# Patient Record
Sex: Female | Born: 2000
Health system: Southern US, Community
[De-identification: ages and names within clinical notes are randomized; demographics above are authoritative.]

## PROBLEM LIST (undated history)

## (undated) DIAGNOSIS — T8859XA Other complications of anesthesia, initial encounter: Secondary | ICD-10-CM

## (undated) DIAGNOSIS — F909 Attention-deficit hyperactivity disorder, unspecified type: Secondary | ICD-10-CM

## (undated) DIAGNOSIS — T4145XA Adverse effect of unspecified anesthetic, initial encounter: Secondary | ICD-10-CM

## (undated) DIAGNOSIS — D573 Sickle-cell trait: Secondary | ICD-10-CM

## (undated) DIAGNOSIS — T7840XA Allergy, unspecified, initial encounter: Secondary | ICD-10-CM

## (undated) DIAGNOSIS — J45909 Unspecified asthma, uncomplicated: Secondary | ICD-10-CM

## (undated) DIAGNOSIS — E669 Obesity, unspecified: Secondary | ICD-10-CM

## (undated) HISTORY — DX: Unspecified asthma, uncomplicated: J45.909

## (undated) HISTORY — PX: EYE SURGERY: SHX253

---
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
## 2020-01-03 ENCOUNTER — Other Ambulatory Visit: Payer: Self-pay

## 2020-01-03 ENCOUNTER — Encounter: Payer: Self-pay | Admitting: Pediatrics

## 2020-01-03 ENCOUNTER — Ambulatory Visit (INDEPENDENT_AMBULATORY_CARE_PROVIDER_SITE_OTHER): Payer: Medicaid Other | Admitting: Pediatrics

## 2020-01-03 VITALS — BP 118/78 | Ht 69.0 in | Wt 305.0 lb

## 2020-01-03 DIAGNOSIS — Z719 Counseling, unspecified: Secondary | ICD-10-CM

## 2020-01-03 DIAGNOSIS — Z79899 Other long term (current) drug therapy: Secondary | ICD-10-CM

## 2020-01-03 DIAGNOSIS — F900 Attention-deficit hyperactivity disorder, predominantly inattentive type: Secondary | ICD-10-CM

## 2020-01-03 DIAGNOSIS — R278 Other lack of coordination: Secondary | ICD-10-CM

## 2020-01-03 DIAGNOSIS — Z7189 Other specified counseling: Secondary | ICD-10-CM

## 2020-01-03 DIAGNOSIS — E66813 Obesity, class 3: Secondary | ICD-10-CM

## 2020-01-03 MED ORDER — ATOMOXETINE HCL 80 MG PO CAPS
80.0000 mg | ORAL_CAPSULE | Freq: Every day | ORAL | 2 refills | Status: DC
Start: 2020-01-03 — End: 2020-01-29

## 2020-01-03 MED ORDER — ATOMOXETINE HCL 25 MG PO CAPS: ORAL_CAPSULE | ORAL | 0 refills | Status: DC

## 2020-01-03 NOTE — Progress Notes (Signed)
Medication Check  Patient ID: [name]  DOB: [birth year]  MRN: [id number]  DATE:01/03/20 Chales Salmon, MD  Accompanied by: self Patient Lives with: mother and brother age 19  HISTORY/CURRENT STATUS: Chief Complaint - Polite and cooperative and present for medical follow up for medication management of ADHD, dysgraphia and Learning differences. Last follow u July 22, 201 and currently prescribed Vyvanse 30 mg.  Reports that with medication trial it worked for focus at work but when wearing off she felt aggressive and irritable.  She did not continue medication and did not request a refill.  Patient also voiced concern for excessive weight, remarked on weight over 305 lb.  Feels this may be due to Depo Shots that she has been on for over two years and has had constant weight gains over this time.  She has been reducing intake and has adequate exercise but is upset that she is not able to lose weight.  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  No Sleep concerns.  Elimination: no concerns  Individual Medical History/ Review of Systems: Changes? :No  Family Medical/ Social History: Changes? No  Current Medications:  None Medication Side Effects: None  MENTAL HEALTH: Mental Health Issues:  Denies sadness, loneliness or depression. No self harm or thoughts of self harm or injury. Denies fears, worries and anxieties. Has good peer relations and is not a bully nor is victimized.  Review of Systems  Constitutional: Negative.   HENT: Negative.    Eyes: Negative.   Respiratory: Negative.   Cardiovascular: Negative.   Gastrointestinal: Negative.   Endocrine: Negative.   Genitourinary: Negative.   Musculoskeletal: Negative.   Skin: Negative.   Allergic/Immunologic: Positive for environmental allergies and food allergies.  Neurological: Negative for seizures, speech difficulty and headaches.  Hematological: Negative.   Psychiatric/Behavioral: Negative for agitation, decreased concentration, dysphoric mood and sleep disturbance. The patient is not nervous/anxious and is not hyperactive.   All other systems reviewed and are negative.   PHYSICAL EXAM; Vitals:   01/03/20 0909  Weight: (!) 305 lb (138.3 kg)  Height: 5\' 9"  (1.753 m)   Body mass index is 45.04 kg/m.  General Physical Exam: Unchanged from previous exam, date:09/27/2019   DIAGNOSES:    ICD-10-CM   1. ADHD (attention deficit hyperactivity disorder), inattentive type  F90.0   2. Developmental dysgraphia  R27.8   3. Obesity, Class III, BMI 40-49.9 (morbid obesity) (HCC)  E66.01   4. Medication management  Z79.899   5. Patient counseled  Z71.9   6. Counseling and coordination of care  Z71.89     RECOMMENDATIONS:  Patient Instructions  DISCUSSION: Counseled regarding the following coordination of care items:  Continue medication as directed Discontinue Vyvanse  Trial Strattera 80 mg every morning Begin with 25 mg one for three days and then increase to two for three days.  Then Strattera 80 mg every day, with food. RX for above e-scribed and sent to pharmacy on record  [id number] - [place], [place] - [address] [place]  [place] [address] Phone: [phone] Fax: [phone]  Recommend discontinuation of Depo shots for birth control due to excessive weight gain over the last two years. Trial MONOPHASIC estrogen product and use pills as "active only".  Begin with a higher estrogen  dose such as in [place] - 35 mcg.  Patient will discuss with provider.  Counseled regarding obtaining refills by calling pharmacy first to use automated refill request then if needed, call our office leaving a detailed message on the refill line.  Counseled medication administration, effects, and possible side effects.  ADHD medications discussed to include different medications and pharmacologic properties of each. Recommendation for specific medication to include dose, administration, expected effects, possible side effects and the risk to benefit ratio of medication management.  Advised importance of:  Good sleep hygiene (8- 10 hours per night)  Limited screen time (none on school nights, no more than 2 hours on weekends)  Regular exercise(outside and active play)  Healthy eating (drink water, no sodas/sweet tea)  Regular family meals have been linked to lower levels of adolescent risk-taking behavior.  Adolescents who frequently eat meals with their family are less likely to engage in risk behaviors than those who never or rarely eat with their families.  So it is never too early to start this tradition.       Patient verbalized understanding of all topics discussed.  NEXT APPOINTMENT:  No follow-ups on file.  Medical Decision-making: More than 50% of the appointment was spent counseling and discussing diagnosis and management of symptoms with the patient and family.  Counseling Time: 25 minutes Total Contact Time: 30 minutes

## 2020-01-03 NOTE — Patient Instructions (Signed)
DISCUSSION: Counseled regarding the following coordination of care items:  Continue medication as directed Discontinue Vyvanse  Trial Strattera 80 mg every morning Begin with 25 mg one for three days and then increase to two for three days.  Then Strattera 80 mg every day, with food. RX for above e-scribed and sent to pharmacy on record  [id number] - [place], [place] - [address] AT [facility] [address] [place] [address] Phone: [phone] Fax: [phone]  Recommend discontinuation of Depo shots for birth control due to excessive weight gain over the last two years. Trial MONOPHASIC estrogen product and use pills as "active only".  Begin with a higher estrogen dose such as in Zovia - 35 mcg.  Patient will discuss with provider.  Counseled regarding obtaining refills by calling pharmacy first to use automated refill request then if needed, call our office leaving a detailed message on the refill line.  Counseled medication administration, effects, and possible side effects.  ADHD medications discussed to include different medications and pharmacologic properties of each. Recommendation for specific medication to include dose, administration, expected effects, possible side effects and the risk to benefit ratio of medication management.  Advised importance of:  Good sleep hygiene (8- 10 hours per night)  Limited screen time (none on school nights, no more than 2 hours on weekends)  Regular exercise(outside and active play)  Healthy eating (drink water, no sodas/sweet tea)  Regular family meals have been linked to lower levels of adolescent risk-taking behavior.  Adolescents who frequently eat meals with their family are less likely to engage in risk behaviors than those who never or rarely eat with their families.  So it is never too early to start this tradition.

## 2020-01-28 ENCOUNTER — Other Ambulatory Visit: Payer: Self-pay | Admitting: Pediatrics

## 2020-01-28 NOTE — Telephone Encounter (Signed)
Last visit 01/03/2020 next visit 04/11/2020

## 2020-01-29 NOTE — Telephone Encounter (Signed)
RX for above e-scribed and sent to pharmacy on record  [id number] - [place], [place] - [address] [place] [address] Phone: [phone] Fax: [phone]    

## 2020-02-04 ENCOUNTER — Other Ambulatory Visit: Payer: Self-pay | Admitting: Pediatrics

## 2020-02-04 MED ORDER — ATOMOXETINE HCL 40 MG PO CAPS
40.0000 mg | ORAL_CAPSULE | Freq: Every day | ORAL | 2 refills | Status: DC
Start: 2020-02-04 — End: 2020-03-03

## 2020-02-04 NOTE — Telephone Encounter (Signed)
RX for above e-scribed and sent to pharmacy on record  [id number] - [place], [place] - [address] [place] [address] Phone: [phone] Fax: [phone]    

## 2020-02-29 ENCOUNTER — Other Ambulatory Visit: Payer: Self-pay | Admitting: Pediatrics

## 2020-03-03 NOTE — Telephone Encounter (Signed)
Strattera 40 mg daily, # 90 with 1 RF's.RX for above e-scribed and sent to pharmacy on record  [id number] - [place], [place] - [address] AT [facility] [address] [place] [address] Phone: [phone] Fax: [phone]

## 2020-04-11 ENCOUNTER — Telehealth (INDEPENDENT_AMBULATORY_CARE_PROVIDER_SITE_OTHER): Payer: Medicaid Other | Admitting: Pediatrics

## 2020-04-11 ENCOUNTER — Other Ambulatory Visit: Payer: Self-pay

## 2020-04-11 ENCOUNTER — Encounter: Payer: Self-pay | Admitting: Pediatrics

## 2020-04-11 DIAGNOSIS — Z719 Counseling, unspecified: Secondary | ICD-10-CM

## 2020-04-11 DIAGNOSIS — Z7189 Other specified counseling: Secondary | ICD-10-CM

## 2020-04-11 DIAGNOSIS — R278 Other lack of coordination: Secondary | ICD-10-CM | POA: Diagnosis not present

## 2020-04-11 DIAGNOSIS — F900 Attention-deficit hyperactivity disorder, predominantly inattentive type: Secondary | ICD-10-CM

## 2020-04-11 DIAGNOSIS — Z79899 Other long term (current) drug therapy: Secondary | ICD-10-CM | POA: Diagnosis not present

## 2020-04-11 NOTE — Progress Notes (Signed)
[facility] [address], [place]. [address] [place] [address] Dept: [phone] Dept Fax: [phone]  Medication Check by Caregility due to COVID-19  Patient ID:  [name]  female DOB: 11-Jun-2000   20 y.o.   MRN: [id number]   DATE:04/11/20  PCP: Chales Salmon, MD  Interviewed: [name]  Location: Mother's home Provider location: [facility] office  Virtual Visit via Video Note Connected with [name] on 04/11/20 at 11:30 AM EST by video enabled telemedicine application and verified that I am speaking with the correct person using two identifiers.     I discussed the limitations, risks, security and privacy concerns of performing an evaluation and management service by telephone and the availability of in person appointments. I also discussed with the parent/patient that there may be a patient responsible charge related to this service. The parent/patient expressed understanding and agreed to proceed.  HISTORY OF PRESENT ILLNESS/CURRENT STATUS: [name] is being followed for medication management for ADHD, dysgraphia and learning differneces.   Last visit on 01/03/20  Briell currently prescribed Strattera 40 mg taking daily, feels it is good and working but the down side is that it does not give you an energy feeling.    Behaviors: takes time to wake up, most teachers are lecturing for an hour and her brain is not engaged.  Eating well (eating breakfast, lunch and dinner).   Elimination: no concerns  Sleeping: Going to bed around 2300 - Sleeping through the night. May nap at end of day.  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  Activities/ Exercise: daily  Screen time: (phone, tablet, TV, computer): non-essential, reduced  MEDICAL HISTORY: Individual Medical History/ Review of Systems: Changes? :No  Family Medical/ Social History: Changes? No   Patient Lives with: mother In Dorm - private room, shares kitchen, has own bathroom for the most part   MENTAL HEALTH: No Concerns ASSESSMENT:  [name] is a 20  Year old with improved and well controlled ADHD and dysgraphia.  Doing very well on Strattera with  ADHD stable with medication management.  She is doing well in Culinary class with appropriate school accommodations with progress academically  DIAGNOSES:    ICD-10-CM   1. ADHD (attention deficit hyperactivity disorder), inattentive type  F90.0   2. Developmental dysgraphia  R27.8   3. Medication management  Z79.899   4. Patient counseled  Z71.9   5. Parenting dynamics counseling  Z71.89   6. Counseling and coordination of care  Z71.89      RECOMMENDATIONS:  Patient Instructions  DISCUSSION: Counseled regarding the following coordination of care items:  Continue medication as directed Strattera 40 mg every morning RX for above e-scribed and sent to pharmacy on record  [id number] - [place], [place] - [address] AT [facility] [address]  [place] [address] Phone: [phone] Fax: [phone]   Counseled regarding obtaining refills by calling pharmacy first to use automated refill request then if needed, call our office leaving a detailed message on the refill line.  Counseled  medication administration, effects, and possible side effects.  ADHD medications discussed to include different medications and pharmacologic properties of each. Recommendation for specific medication to include dose, administration, expected effects, possible  side effects and the risk to benefit ratio of medication management.  Advised importance of:  Good sleep hygiene (8- 10 hours per night)  Limited screen time (none on school nights, no more than 2 hours on weekends)  Regular exercise(outside and active play)  Healthy eating (drink water, no sodas/sweet tea)   Counseling at this visit included the review of old records and/or current chart.   Counseling included the following discussion points presented at every visit to improve understanding and treatment compliance.  Recent health history and today's examination Growth and development with anticipatory guidance provided regarding brain growth, executive function maturation and pre or pubertal development.  School progress and continued advocay for appropriate accommodations to include maintain Structure, routine, organization, reward, motivation and consequences.  Additionally the patient was counseled to take medication while driving.       NEXT APPOINTMENT:  Return in about 3 months (around 07/09/2020) for Medication Check. Please call the office for a sooner appointment if problems arise.  Medical Decision-making:  I spent 25 minutes dedicated to the care of this patient on the date of this encounter to include face to face time with the patient and/or parent reviewing medical records and documentation by teachers, performing and discussing the assessment and treatment plan, reviewing and explaining completed speciality labs and obtaining specialty lab samples.  The patient and/or parent was provided an opportunity to ask questions and all were answered. The patient and/or parent agreed with the plan and demonstrated an understanding of the instructions.   The patient and/or parent was advised to call back or seek an in-person evaluation if the symptoms worsen or if the condition fails to improve as anticipated.  I provided 25 minutes of non-face-to-face time during this  encounter.   Completed record review for 0 minutes prior to and after the virtual video visit.   Counseling Time: 25 minutes   Total Contact Time: 25 minutes

## 2020-04-11 NOTE — Patient Instructions (Signed)
DISCUSSION: Counseled regarding the following coordination of care items:  Continue medication as directed Strattera 40 mg every morning RX for above e-scribed and sent to pharmacy on record  [id number] - [place], [place] - [address] AT [facility] [address] [place] [address] Phone: [phone] Fax: [phone]   Counseled regarding obtaining refills by calling pharmacy first to use automated refill request then if needed, call our office leaving a detailed message on the refill line.  Counseled medication administration, effects, and possible side effects.  ADHD medications discussed to include different medications and pharmacologic properties of each. Recommendation for specific medication to include dose, administration, expected effects, possible side effects and the risk to benefit ratio of medication management.  Advised importance of:  Good sleep hygiene (8- 10 hours per night)  Limited screen time (none on school nights, no more than 2 hours on weekends)  Regular exercise(outside and active play)  Healthy eating (drink water, no sodas/sweet tea)   Counseling at this visit included the review of old records and/or current chart.   Counseling included the following discussion points presented at every visit to improve understanding and treatment compliance.  Recent health history and today's examination Growth and development with anticipatory guidance provided regarding brain growth, executive function maturation and pre or pubertal development.  School progress and continued advocay for appropriate accommodations to include maintain Structure, routine, organization, reward, motivation and consequences.  Additionally the patient was counseled to take medication while driving.

## 2020-06-14 IMAGING — CR DG CHEST 2V
2 series · 2 of 2 positions shown · non-contrast
Comparison: 01/02/2009

CLINICAL DATA: Chest pain and shortness of Breath

EXAM:
CHEST - 2 VIEW

[chest pa]
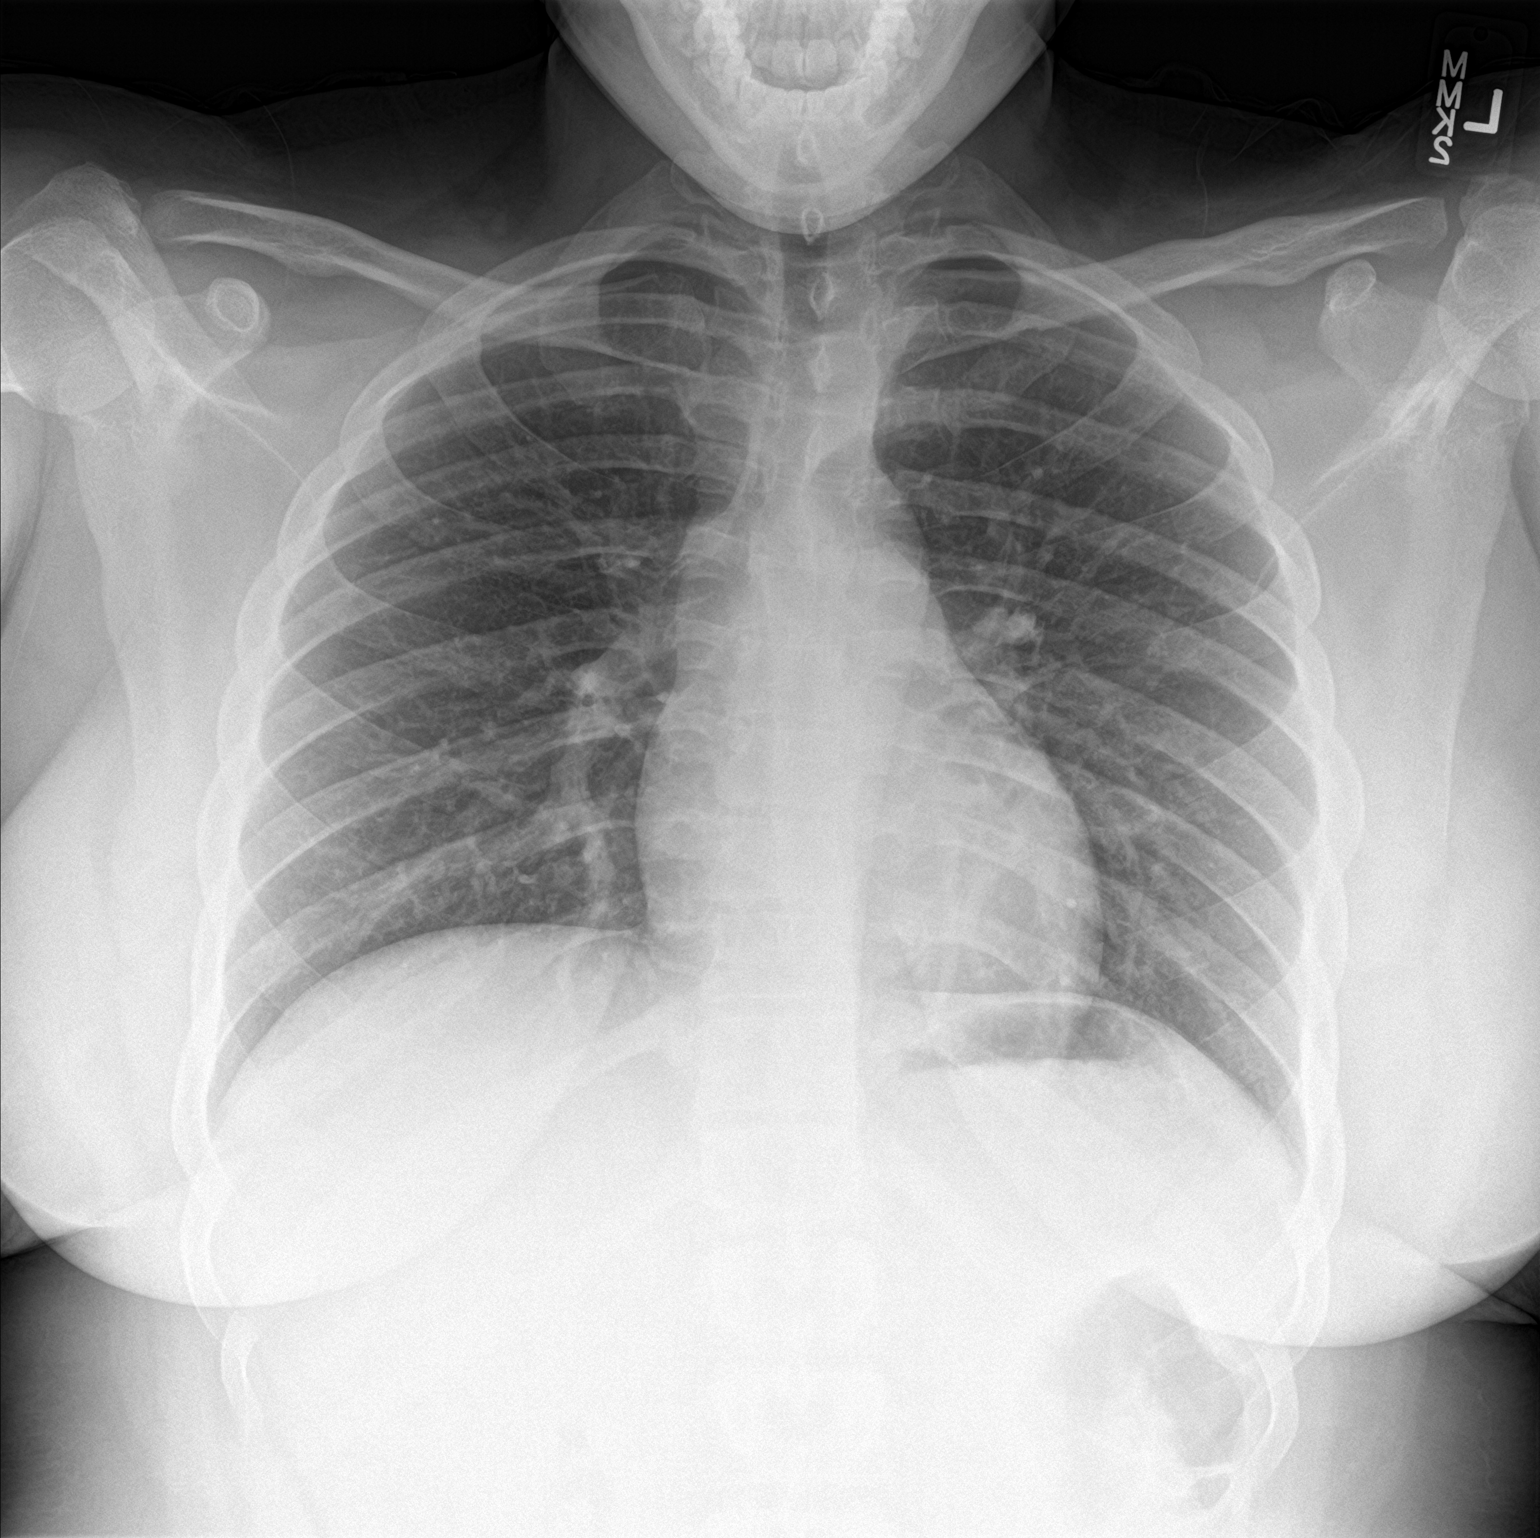

[chest lat]
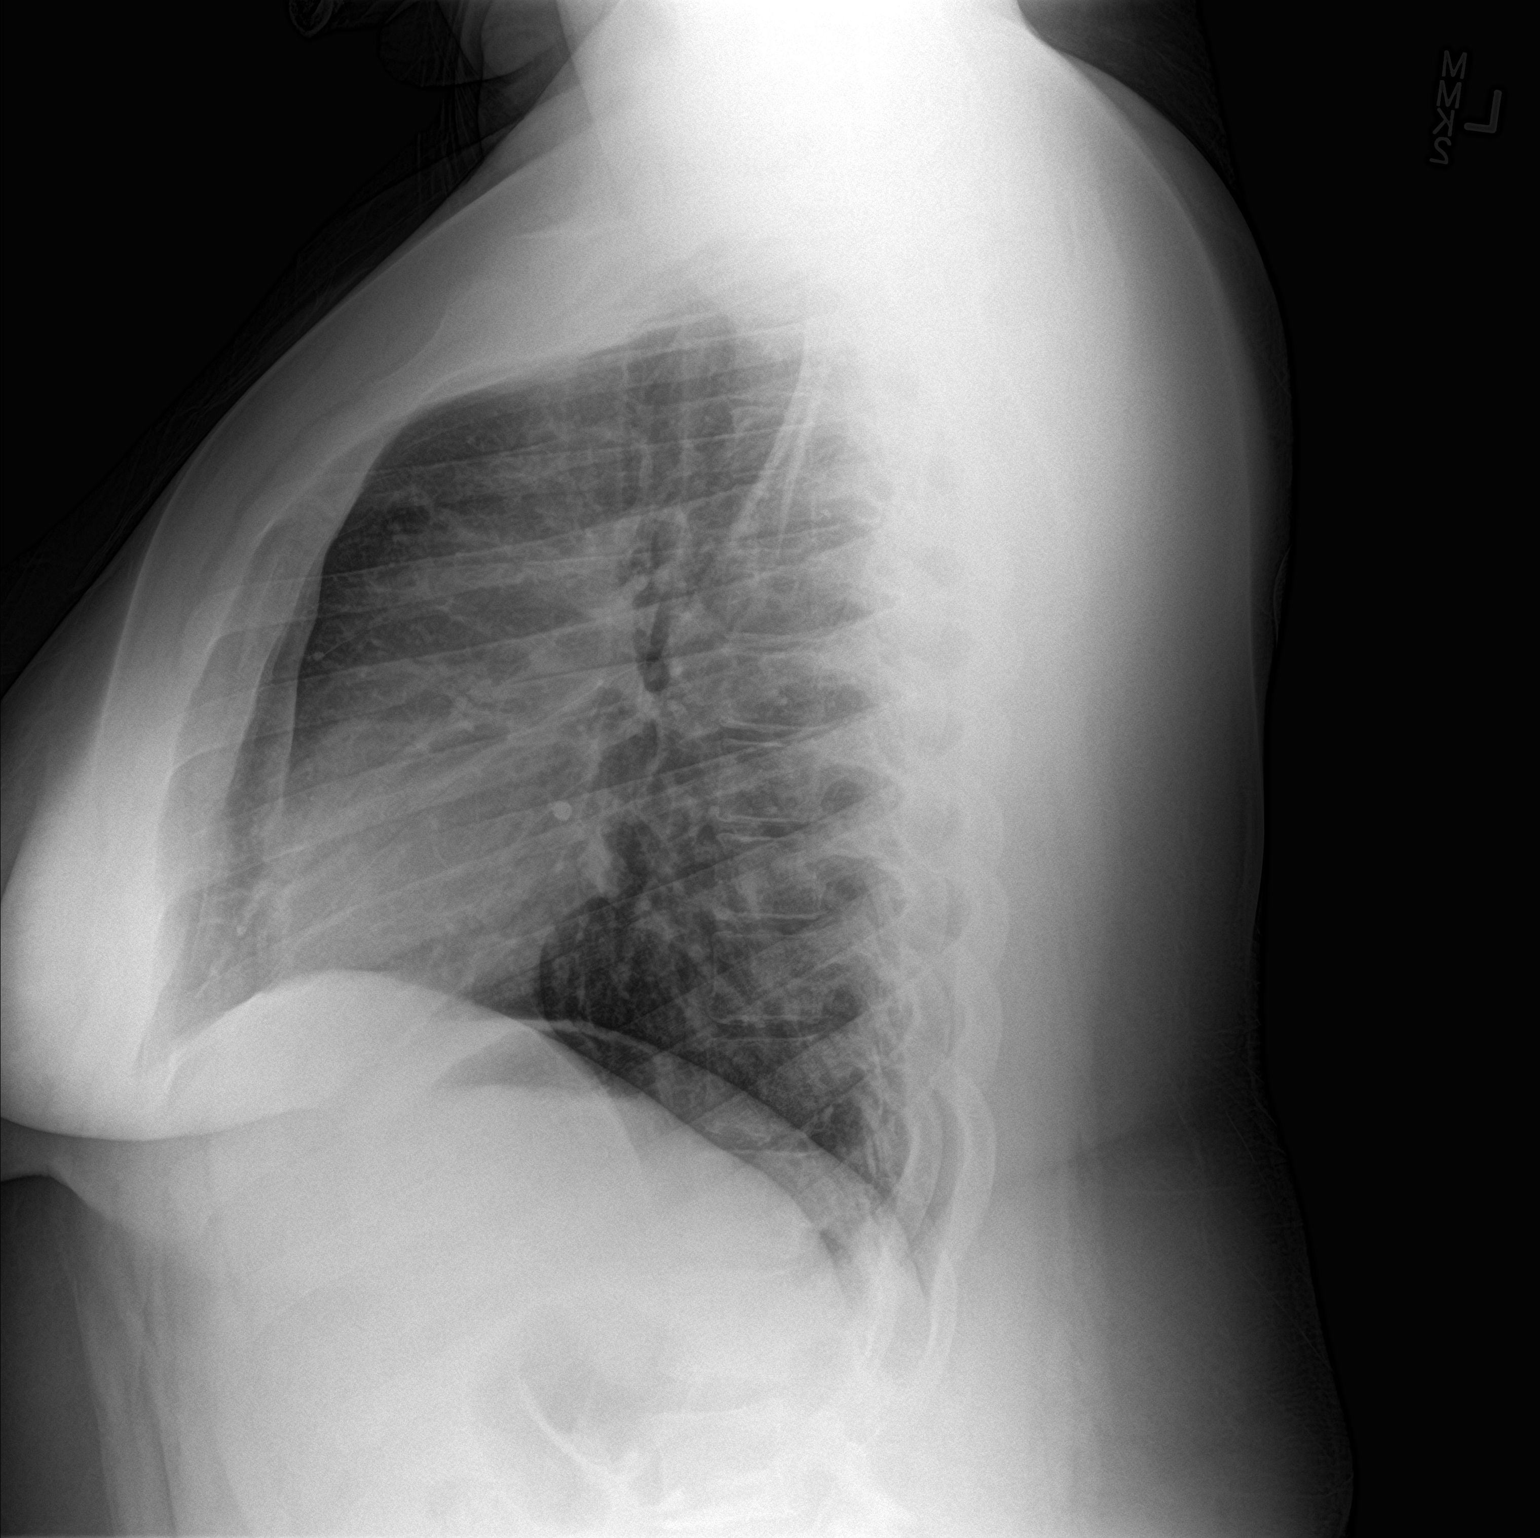

[2 of 2 positions shown; findings below may reference images not displayed]

FINDINGS: The heart size and mediastinal contours are within normal limits.
Both lungs are clear. The visualized skeletal structures are
unremarkable.
IMPRESSION: No active cardiopulmonary disease.

## 2020-09-08 ENCOUNTER — Encounter (HOSPITAL_COMMUNITY): Payer: Self-pay | Admitting: Emergency Medicine

## 2020-09-08 ENCOUNTER — Emergency Department (HOSPITAL_COMMUNITY)
Admission: EM | Admit: 2020-09-08 | Discharge: 2020-09-09 | Disposition: A | Discharge status: Home / Self Care | Payer: Medicaid Other | Attending: Emergency Medicine | Admitting: Emergency Medicine

## 2020-09-08 ENCOUNTER — Other Ambulatory Visit: Payer: Self-pay

## 2020-09-08 ENCOUNTER — Emergency Department (HOSPITAL_COMMUNITY): Payer: Medicaid Other

## 2020-09-08 DIAGNOSIS — S70312A Abrasion, left thigh, initial encounter: Secondary | ICD-10-CM | POA: Insufficient documentation

## 2020-09-08 DIAGNOSIS — S60411A Abrasion of left index finger, initial encounter: Secondary | ICD-10-CM | POA: Diagnosis not present

## 2020-09-08 DIAGNOSIS — J45909 Unspecified asthma, uncomplicated: Secondary | ICD-10-CM | POA: Insufficient documentation

## 2020-09-08 DIAGNOSIS — S0181XA Laceration without foreign body of other part of head, initial encounter: Secondary | ICD-10-CM | POA: Diagnosis not present

## 2020-09-08 DIAGNOSIS — Y92009 Unspecified place in unspecified non-institutional (private) residence as the place of occurrence of the external cause: Secondary | ICD-10-CM | POA: Insufficient documentation

## 2020-09-08 DIAGNOSIS — S81811A Laceration without foreign body, right lower leg, initial encounter: Secondary | ICD-10-CM | POA: Insufficient documentation

## 2020-09-08 DIAGNOSIS — W540XXA Bitten by dog, initial encounter: Secondary | ICD-10-CM | POA: Insufficient documentation

## 2020-09-08 DIAGNOSIS — S30811A Abrasion of abdominal wall, initial encounter: Secondary | ICD-10-CM | POA: Insufficient documentation

## 2020-09-08 DIAGNOSIS — S0990XA Unspecified injury of head, initial encounter: Secondary | ICD-10-CM | POA: Diagnosis present

## 2020-09-08 DIAGNOSIS — S81851A Open bite, right lower leg, initial encounter: Secondary | ICD-10-CM

## 2020-09-08 MED ORDER — OXYCODONE HCL 5 MG PO TABS
10.0000 mg | ORAL_TABLET | Freq: Once | ORAL | Status: AC
  Administered 2020-09-08: 10 mg via ORAL
  Filled 2020-09-08: qty 2

## 2020-09-08 MED ORDER — OXYCODONE-ACETAMINOPHEN 5-325 MG PO TABS: 1.0000 | ORAL_TABLET | ORAL | 0 refills | Status: DC | PRN

## 2020-09-08 MED ORDER — AMOXICILLIN-POT CLAVULANATE 875-125 MG PO TABS: 1.0000 | ORAL_TABLET | Freq: Two times a day (BID) | ORAL | 0 refills | Status: DC

## 2020-09-08 MED ORDER — BACITRACIN ZINC 500 UNIT/GM EX OINT: TOPICAL_OINTMENT | Freq: Once | CUTANEOUS | Status: AC

## 2020-09-08 MED ORDER — AMOXICILLIN-POT CLAVULANATE 875-125 MG PO TABS
1.0000 | ORAL_TABLET | Freq: Once | ORAL | Status: AC
  Administered 2020-09-08: 1 via ORAL
  Filled 2020-09-08: qty 1

## 2020-09-08 MED ORDER — LIDOCAINE-EPINEPHRINE (PF) 2 %-1:200000 IJ SOLN
20.0000 mL | Freq: Once | INTRAMUSCULAR | Status: AC
  Administered 2020-09-09: 20 mL via INTRADERMAL
  Filled 2020-09-08: qty 20

## 2020-09-08 MED ORDER — ACETAMINOPHEN 325 MG PO TABS
650.0000 mg | ORAL_TABLET | Freq: Once | ORAL | Status: AC | PRN
  Administered 2020-09-08: 650 mg via ORAL
  Filled 2020-09-08: qty 2

## 2020-09-08 MED ORDER — LIDOCAINE HCL (PF) 1 % IJ SOLN
INTRAMUSCULAR | Status: AC
  Filled 2020-09-08: qty 30

## 2020-09-08 NOTE — ED Notes (Signed)
Pt taken to bathroom in wheelchair with assistance

## 2020-09-08 NOTE — ED Provider Notes (Signed)
Emergency Medicine Provider Triage Evaluation Note  [name] , a 20 y.o. female  was evaluated in triage.  Pt complains of dog bite.  Patient was attacked by her aunts dogs just prior to arrival.  She has had a laceration to the chin as well as a laceration to the back of the right ankle and a scratch to the abdomen and left upper thigh.  She is going to clarify with her aunt whether or not these dogs are up-to-date on rabies vaccination.  She will also check with her mom on tetanus vaccination.  Patient is very anxious, counseled patient on typical process of treatment for dog bite.  Review of Systems  Positive: Multiple abrasions wounds Negative: Fever, redness, swelling  Physical Exam  BP 118/73 (BP Location: Right Arm)   Pulse (!) 120   Temp 98.4 F (36.9 C) (Oral)   Resp 19   SpO2 98%  Gen:   Awake, no distress   Resp:  Normal effort  MSK:   Moves extremities without difficulty  Other:  Laceration to the left side of the chin, and the back of the right ankle, linear scratches to the left thigh and abdomen.  Medical Decision Making  Medically screening exam initiated at 5:03 PM.  Appropriate orders placed.  [name] was informed that the remainder of the evaluation will be completed by another provider, this initial triage assessment does not replace that evaluation, and the importance of remaining in the ED until their evaluation is complete.     Dartha Lodge, PA-C 09/08/20 1706    Mancel Bale, MD 09/08/20 [phone]

## 2020-09-08 NOTE — Discharge Instructions (Addendum)
The sutures placed in your face will dissolve on their own.  You will need to see your family doctor for removal of the leg sutures in 10-14 days.

## 2020-09-08 NOTE — Progress Notes (Signed)
Orthopedic Tech Progress Note Patient Details:  [name] May 21, 2000 [id number]  Ortho Devices Type of Ortho Device: CAM walker Ortho Device/Splint Location: rle Ortho Device/Splint Interventions: Ordered, Adjustment, Application   Post Interventions Patient Tolerated: Well Instructions Provided: Care of device, Adjustment of device  [name] Post 09/08/2020, 11:28 PM

## 2020-09-08 NOTE — ED Triage Notes (Addendum)
Pt reports being attacked by 2 of her aunt's dogs today. Unsure of dogs vaccination status. Wound to her chin, left ankle, and abdomen.

## 2020-09-08 NOTE — ED Provider Notes (Addendum)
[facility] EMERGENCY DEPARTMENT Provider Note   CSN: [id number] Arrival date & time: 09/08/20  1612     History Chief Complaint  Patient presents with   Animal Bite    [name] is a 20 y.o. female.  HPI 20 year old female presents with dog bites and lacerations.  This occurred earlier today just prior to arrival.  She was at her mom's friend's house to learn about how to talk stent and the dogs were aggressive towards her and she fell to the ground and they attacked her.  Has multiple scratches to her abdomen, left thigh, finger, as well as a laceration to her left chin and her right posterior ankle.  She is having a hard time walking because of the pain.  Dog shots are up-to-date.  She thinks her tetanus is up-to-date.  She received all of her childhood immunizations.  Past Medical History:  Diagnosis Date   ADHD (attention deficit hyperactivity disorder)    Allergy    Complication of anesthesia    a little slow to wake up    Obesity    Sickle cell trait [facility])     Patient Active Problem List   Diagnosis Date Noted   Gastroesophageal reflux disease 06/30/2018   Anaphylactic shock due to adverse food reaction 03/17/2018   Seasonal and perennial allergic rhinitis 03/17/2018   Seasonal allergic conjunctivitis 10/17/2015   Mild intermittent asthma, uncomplicated 10/17/2015   ADHD (attention deficit hyperactivity disorder), inattentive type 07/09/2015   Developmental dysgraphia 07/09/2015   Obesity, Class III, BMI 40-49.9 (morbid obesity) (HCC) 07/09/2015    Past Surgical History:  Procedure Laterality Date   ADENOIDECTOMY  02/2014   EYE SURGERY     TONSILLECTOMY  02/2014   TONSILLECTOMY/ADENOIDECTOMY/TURBINATE REDUCTION  02/28/2012   Procedure: TONSILLECTOMY/ADENOIDECTOMY/TURBINATE REDUCTION;  Surgeon: Osborn Coho, MD;  Location: [facility];  Service: ENT;  Laterality: N/A;   TOOTH EXTRACTION N/A 12/27/2014   Procedure: DENTAL  RESTORATION/EXTRACTIONS / Extracted impacted wisdom teeth numbers one, sixteen, seventeen, thirty-two.;  Surgeon: Ocie Doyne, DDS;  Location: [facility] OR;  Service: Oral Surgery;  Laterality: N/A;     OB History   No obstetric history on file.     Family History  Problem Relation Age of Onset   Allergic rhinitis Mother    Hypertension Mother    Diabetes Mother    Allergic rhinitis Brother    Lung cancer Maternal Grandmother    Angioedema Neg Hx    Asthma Neg Hx    Eczema Neg Hx    Immunodeficiency Neg Hx    Urticaria Neg Hx     Social History   Tobacco Use   Smoking status: Never   Smokeless tobacco: Never  Vaping Use   Vaping Use: Never used  Substance Use Topics   Alcohol use: No   Drug use: No    Home Medications Prior to Admission medications   Medication Sig Start Date End Date Taking? Authorizing Provider  amoxicillin-clavulanate (AUGMENTIN) 875-125 MG tablet Take 1 tablet by mouth 2 (two) times daily. One po bid x 7 days 09/08/20   Pricilla Loveless, MD  atomoxetine (STRATTERA) 40 MG capsule TAKE 1 CAPSULE BY MOUTH EVERY DAY 03/03/20   Paretta-Leahey, Miachel Roux, NP  cetirizine (ZYRTEC) 10 MG tablet Take 10 mg by mouth daily as needed for allergies. Patient not taking: Reported on 09/27/2019    [provider]  EPINEPHrine 0.3 mg/0.3 mL IJ SOAJ injection Inject 0.3 mLs (0.3 mg total) into the  muscle as needed. Patient not taking: Reported on 09/27/2019 02/09/19   Hetty BlendAmbs, Anne M, FNP  medroxyPROGESTERone (DEPO-PROVERA) 150 MG/ML injection  10/17/19   [provider]  oxyCODONE-acetaminophen (PERCOCET/ROXICET) 5-325 MG tablet Take 1 tablet by mouth every 4 (four) hours as needed for severe pain. 09/08/20   Pricilla LovelessGoldston, Husayn Reim, MD    Allergies    Dust mite extract, Molds & smuts, and Red dye  Review of Systems   Review of Systems  Musculoskeletal:  Positive for arthralgias.  Skin:  Positive for wound.  All other systems reviewed and are negative.  Physical  Exam Updated Vital Signs BP 131/73   Pulse 92   Temp 98.4 F (36.9 C) (Oral)   Resp 18   SpO2 99%   Physical Exam Vitals and nursing note reviewed.  Constitutional:      Appearance: She is well-developed.  HENT:     Head: Normocephalic.      Right Ear: External ear normal.     Left Ear: External ear normal.     Nose: Nose normal.  Eyes:     General:        Right eye: No discharge.        Left eye: No discharge.  Cardiovascular:     Rate and Rhythm: Normal rate and regular rhythm.     Pulses:          Dorsalis pedis pulses are 2+ on the right side.  Pulmonary:     Effort: Pulmonary effort is normal.     Breath sounds: Normal breath sounds.  Abdominal:     Palpations: Abdomen is soft.     Tenderness: There is no abdominal tenderness.  Musculoskeletal:     Right ankle: Laceration present. Tenderness present. Decreased range of motion.     Right Achilles Tendon: No defects. Thompson's test negative.       Legs:  Skin:    General: Skin is warm and dry.     Comments: Abrasions to abdomen, left posterior thigh, left index finger  Neurological:     Mental Status: She is alert.  Psychiatric:        Mood and Affect: Mood is not anxious.    ED Results / Procedures / Treatments   Labs (all labs ordered are listed, but only abnormal results are displayed) Labs Reviewed - No data to display  EKG None  Radiology DG Ankle Complete Right  Result Date: 09/08/2020 CLINICAL DATA:  Dog bite.  Right ankle pain and swelling. EXAM: RIGHT ANKLE - COMPLETE 3+ VIEW COMPARISON:  None. FINDINGS: There is no evidence of fracture, dislocation, or joint effusion. There is no evidence of arthropathy or other focal bone abnormality. Soft tissues are unremarkable. No evidence of soft tissue gas or radiopaque foreign body. IMPRESSION: Negative. Electronically Signed   By: Danae OrleansJohn A Stahl M.D.   On: 09/08/2020 17:29    Procedures LACERATION REPAIR  Date/Time: 09/08/2020 10:16 PM Performed by:  Pricilla LovelessGoldston, Yong Wahlquist, MD Authorized by: Pricilla LovelessGoldston, Neveah Bang, MD   Consent:    Consent obtained:  Verbal   Consent given by:  Patient   Risks, benefits, and alternatives were discussed: yes   Universal protocol:    Patient identity confirmed:  Verbally with patient Anesthesia:    Anesthesia method:  Local infiltration   Local anesthetic:  Lidocaine 2% WITH epi Laceration details:    Location:  Leg   Leg location:  R lower leg   Length (cm):  5 Pre-procedure details:    Preparation:  Patient was prepped and draped in usual sterile fashion and imaging obtained to evaluate for foreign bodies Exploration:    Limited defect created (wound extended): no     Hemostasis achieved with:  Direct pressure   Imaging obtained: x-ray     Imaging outcome: foreign body not noted   Treatment:    Area cleansed with:  Saline   Amount of cleaning:  Extensive   Irrigation solution:  Sterile saline   Irrigation method:  Syringe Skin repair:    Repair method:  Sutures   Suture size:  4-0   Suture material:  Prolene   Suture technique:  Simple interrupted   Number of sutures:  5 Approximation:    Approximation:  Close Repair type:    Repair type:  Simple Post-procedure details:    Dressing:  Antibiotic ointment   Procedure completion:  Tolerated well, no immediate complications .Marland KitchenLaceration Repair  Date/Time: 09/08/2020 11:02 PM Performed by: Pricilla Loveless, MD Authorized by: Pricilla Loveless, MD   Consent:    Consent obtained:  Verbal   Consent given by:  Patient   Risks, benefits, and alternatives were discussed: yes   Universal protocol:    Patient identity confirmed:  Verbally with patient Anesthesia:    Anesthesia method:  Local infiltration   Local anesthetic:  Lidocaine 1% w/o epi Laceration details:    Location:  Face   Face location:  Chin   Length (cm):  3 Pre-procedure details:    Preparation:  Patient was prepped and draped in usual sterile fashion Exploration:    Contaminated:  no   Treatment:    Area cleansed with:  Saline   Amount of cleaning:  Extensive   Irrigation solution:  Sterile saline   Irrigation method:  Syringe Skin repair:    Repair method:  Sutures   Suture size:  5-0   Suture material:  Fast-absorbing gut   Suture technique:  Simple interrupted   Number of sutures:  4 Approximation:    Approximation:  Close Repair type:    Repair type:  Intermediate Post-procedure details:    Dressing:  Antibiotic ointment   Procedure completion:  Tolerated well, no immediate complications   Medications Ordered in ED Medications  lidocaine-EPINEPHrine (XYLOCAINE W/EPI) 2 %-1:200000 (PF) injection 20 mL (has no administration in time range)  lidocaine (PF) (XYLOCAINE) 1 % injection (has no administration in time range)  bacitracin ointment (has no administration in time range)  acetaminophen (TYLENOL) tablet 650 mg (650 mg Oral Given 09/08/20 1852)  oxyCODONE (Oxy IR/ROXICODONE) immediate release tablet 10 mg (10 mg Oral Given 09/08/20 2053)  amoxicillin-clavulanate (AUGMENTIN) 875-125 MG per tablet 1 tablet (1 tablet Oral Given 09/08/20 2053)    ED Course  I have reviewed the triage vital signs and the nursing notes.  Pertinent labs & imaging results that were available during my care of the patient were reviewed by me and considered in my medical decision making (see chart for details).    MDM Rules/Calculators/A&P                          Patient is NV intact. Has multiple abrasions, and 2 lacerations. We discussed risks/benefits of closing, but given it's not on foot and the face is involved and she's going to be on antibiotics, then I think it's reasonable to close primarily. They were extensively cleaned/irrigated. No achilles tendon injury. Tdap up to date. Will give pain control, antibiotics. We discussed return precautions.  Final Clinical Impression(s) / ED Diagnoses Final diagnoses:  Dog bite of face, initial encounter  Dog bite of lower leg,  right, initial encounter    Rx / DC Orders ED Discharge Orders          Ordered    amoxicillin-clavulanate (AUGMENTIN) 875-125 MG tablet  2 times daily,   Status:  Discontinued        09/08/20 2256    oxyCODONE-acetaminophen (PERCOCET/ROXICET) 5-325 MG tablet  Every 4 hours PRN,   Status:  Discontinued        09/08/20 2256    oxyCODONE-acetaminophen (PERCOCET/ROXICET) 5-325 MG tablet  Every 4 hours PRN        09/08/20 2258    amoxicillin-clavulanate (AUGMENTIN) 875-125 MG tablet  2 times daily        09/08/20 2258             Pricilla Loveless, MD 09/08/20 3546    Pricilla Loveless, MD 09/09/20 [phone]

## 2020-09-08 NOTE — ED Notes (Signed)
Pt's wounds cleansed. 

## 2020-09-09 NOTE — ED Notes (Signed)
Patient verbalizes understanding of discharge instructions. Opportunity for questioning and answers were provided. Armband removed by staff, pt discharged from ED via wheelchair.  

## 2020-09-24 ENCOUNTER — Ambulatory Visit: Payer: Self-pay | Admitting: *Deleted

## 2020-09-24 NOTE — Telephone Encounter (Signed)
Answer Assessment - Initial Assessment Questions 1. REASON FOR CALL or QUESTION: "What is your reason for calling today?" or "How can I best help you?" or "What question do you have that I can help answer?"     I had a dog bite with stitches.   It has a lump in it.   She called her PCP and was instructed to go to the ED.   She called the [facility] questioning if she needed to go back to the ED.     I let her know she did.   A doctor needs to evaluate the bite for infection, etc so yes she did need to return to the ED.  She was agreeable and thanked me for my help.  Protocols used: Information Only Call - No Triage-A-AH

## 2020-09-24 NOTE — Telephone Encounter (Signed)
Pt called in questioning if she needed to return to the ED per the instructions from her PCP.   See assessment section.   I let her know she did need to return to the ED.

## 2020-12-22 ENCOUNTER — Ambulatory Visit (INDEPENDENT_AMBULATORY_CARE_PROVIDER_SITE_OTHER): Payer: Medicaid Other | Admitting: Plastic Surgery

## 2020-12-22 ENCOUNTER — Other Ambulatory Visit: Payer: Self-pay

## 2020-12-22 DIAGNOSIS — L910 Hypertrophic scar: Secondary | ICD-10-CM | POA: Diagnosis not present

## 2020-12-22 NOTE — Progress Notes (Signed)
Referring Provider Declaire, Doristine Church, MD [address] [place],  [place] [address]   CC:  Chief Complaint  Patient presents with   Advice Only      [name] is an 20 y.o. female.  HPI: Patient presents with scars after encounter with a dog back in July.  She has 1 on the left chin and one in the upper abdomen that are most bothersome to her.  She has a few others on her thigh and ankle which she also points out.  They are all raised and with the exception of her chin has become darkly pigmented.  They itch and are sensitive.  She wants to see if anything could be done.  Allergies  Allergen Reactions   Dust Mite Extract    Molds & Smuts    Red Dye     Outpatient Encounter Medications as of 12/22/2020  Medication Sig   amoxicillin-clavulanate (AUGMENTIN) 875-125 MG tablet Take 1 tablet by mouth 2 (two) times daily. One po bid x 7 days   atomoxetine (STRATTERA) 40 MG capsule TAKE 1 CAPSULE BY MOUTH EVERY DAY   cetirizine (ZYRTEC) 10 MG tablet Take 10 mg by mouth daily as needed for allergies. (Patient not taking: Reported on 09/27/2019)   EPINEPHrine 0.3 mg/0.3 mL IJ SOAJ injection Inject 0.3 mLs (0.3 mg total) into the muscle as needed. (Patient not taking: Reported on 09/27/2019)   medroxyPROGESTERone (DEPO-PROVERA) 150 MG/ML injection    oxyCODONE-acetaminophen (PERCOCET/ROXICET) 5-325 MG tablet Take 1 tablet by mouth every 4 (four) hours as needed for severe pain.   No facility-administered encounter medications on file as of 12/22/2020.     Past Medical History:  Diagnosis Date   ADHD (attention deficit hyperactivity disorder)    Allergy    Complication of anesthesia    a little slow to wake up    Obesity    Sickle cell trait [facility])     Past Surgical History:  Procedure Laterality Date   ADENOIDECTOMY  02/2014   EYE SURGERY     TONSILLECTOMY  02/2014   TONSILLECTOMY/ADENOIDECTOMY/TURBINATE REDUCTION  02/28/2012   Procedure:  TONSILLECTOMY/ADENOIDECTOMY/TURBINATE REDUCTION;  Surgeon: Osborn Coho, MD;  Location: [facility];  Service: ENT;  Laterality: N/A;   TOOTH EXTRACTION N/A 12/27/2014   Procedure: DENTAL RESTORATION/EXTRACTIONS / Extracted impacted wisdom teeth numbers one, sixteen, seventeen, thirty-two.;  Surgeon: Ocie Doyne, DDS;  Location: [facility] OR;  Service: Oral Surgery;  Laterality: N/A;    Family History  Problem Relation Age of Onset   Allergic rhinitis Mother    Hypertension Mother    Diabetes Mother    Allergic rhinitis Brother    Lung cancer Maternal Grandmother    Angioedema Neg Hx    Asthma Neg Hx    Eczema Neg Hx    Immunodeficiency Neg Hx    Urticaria Neg Hx     Social History   Social History Narrative   Not on file     Review of Systems General: Denies fevers, chills, weight loss CV: Denies chest pain, shortness of breath, palpitations  Physical Exam Vitals with BMI 09/09/2020 09/08/2020 09/08/2020  Height - - -  Weight - - -  BMI - - -  Systolic 149 131 518  Diastolic 75 73 55  Pulse 94 92 94  Some encounter information is confidential and restricted. Go to Review Flowsheets activity to see all data.    General:  No acute distress,  Alert and oriented, Non-Toxic, Normal speech and  affect Examination shows a raised scar in the left side of her chin which is about a centimeter to a centimeter half in diameter.  The pigment matches nicely with the surrounding skin.  On her abdomen she has a 3 to 4 cm transverse raised scar which has the appearance of a keloid.  On her ankle she has a few small scars which do not appear to be as raised as the abdomen.  She has 2 other keloids on the upper inner aspect of her left thigh.  Assessment/Plan Patient has a number of hypertrophic or keloid scars from a dog bite encounter which occurred 3 months ago.  We discussed a number of options.  I explained I was hesitant to excise them right away as she could just perform keloids  after the excision.  We discussed steroid injections.  We elected to perform steroid injection in the upper abdomen keloid to see how it responds prior to injecting her face which is her priority at this point.  The upper abdomen was prepped and alcohol pad and 1 cc of Kenalog 40 was diluted in 2-1/2 cc of lidocaine with epinephrine and infiltrated into the keloid itself.  She tolerated this well.  We will see her again in 8 weeks to check her progress.  All of her questions were answered.  [name] 12/22/2020, 10:04 AM

## 2020-12-25 ENCOUNTER — Institutional Professional Consult (permissible substitution): Payer: Medicaid Other | Admitting: Plastic Surgery

## 2021-01-08 ENCOUNTER — Inpatient Hospital Stay: Admit: 2021-01-08 | Payer: Self-pay

## 2021-02-19 ENCOUNTER — Ambulatory Visit (INDEPENDENT_AMBULATORY_CARE_PROVIDER_SITE_OTHER): Payer: Medicaid Other | Admitting: Plastic Surgery

## 2021-02-19 ENCOUNTER — Other Ambulatory Visit: Payer: Self-pay

## 2021-02-19 DIAGNOSIS — L910 Hypertrophic scar: Secondary | ICD-10-CM

## 2021-02-19 NOTE — Progress Notes (Signed)
° °  Referring Provider Chales Salmon, MD [address] [place],  [place] [address]   CC:  Chief Complaint  Patient presents with   Follow-up      [name] is an 20 y.o. female.  HPI: Patient presents in follow-up for injection of a abdominal keloid.  She feels like this did flatten it quite a bit.  It was done about 2 months ago.  She did report a small scab that flaked off 1 small part of it.  She would like me to look at some keloids on her inner left thigh as well today.  Review of Systems General: Denies fevers and chills  Physical Exam Vitals with BMI 09/09/2020 09/08/2020 09/08/2020  Height - - -  Weight - - -  BMI - - -  Systolic 149 131 219  Diastolic 75 73 55  Pulse 94 92 94  Some encounter information is confidential and restricted. Go to Review Flowsheets activity to see all data.    General:  No acute distress,  Alert and oriented, Non-Toxic, Normal speech and affect On exam the abdominal keloid is quite a bit flatter.  It has not lightened up as much as I expected but it is certainly flatter and less symptomatic.  There is a small scab over about a centimeter of it superiorly that looks like a healing ulcer.  On her left thigh on the inner aspect there are 2 keloids that are about 3 to 4 cm in length.  Assessment/Plan Patient is done well from a injection of her abdominal keloid.  I did inject the 2 keloids on the inner aspect of her left thigh.  These areas were prepped with an alcohol pad and Kenalog 40 mixed with lidocaine was injected.  A total of 2 cc of this mixture was utilized.  She tolerated this fine.  We will see her again in 2 months to check her progress.  All her questions were answered.  Allena Napoleon 02/19/2021, 12:37 PM

## 2021-04-16 ENCOUNTER — Ambulatory Visit (INDEPENDENT_AMBULATORY_CARE_PROVIDER_SITE_OTHER): Payer: Medicaid Other | Admitting: Plastic Surgery

## 2021-04-16 ENCOUNTER — Other Ambulatory Visit: Payer: Self-pay

## 2021-04-16 ENCOUNTER — Encounter: Payer: Self-pay | Admitting: Pediatrics

## 2021-04-16 DIAGNOSIS — L910 Hypertrophic scar: Secondary | ICD-10-CM

## 2021-04-16 NOTE — Progress Notes (Signed)
° °  Referring Provider Chales Salmon, MD [address] [place],  [place] [address]   CC:  Chief Complaint  Patient presents with   Follow-up      [name] is an 21 y.o. female.  HPI: Patient presents about 8 weeks out from keloid injection in her inner thigh.  She feels like this is gone well and flatten them and satisfied with that area.  She again brings up thick keloid in her left chin area and would like a steroid injection there as its painful and swollen and tender like the other keloids were.  Review of Systems General: Denies fevers and chills  Physical Exam Vitals with BMI 09/09/2020 09/08/2020 09/08/2020  Height - - -  Weight - - -  BMI - - -  Systolic 149 131 527  Diastolic 75 73 55  Pulse 94 92 94  Some encounter information is confidential and restricted. Go to Review Flowsheets activity to see all data.    General:  No acute distress,  Alert and oriented, Non-Toxic, Normal speech and affect On exam the keloids in her inner left thigh have flattened quite a bit.  You still can see some pigment but there is certainly improved.  She does have a 2 cm diameter firm nodular keloid in the left chin area that is unchanged from previous visits.  Assessment/Plan Chin was prepped with an alcohol pad and 0.5 cc of Kenalog 40 was mixed with 1 cc of lidocaine with epinephrine and injected into the lesion.  She tolerated this fine.  We will plan to see her in 8 weeks to check her progress.  All of her questions were answered.  Allena Napoleon 04/16/2021, 10:19 AM

## 2021-06-11 ENCOUNTER — Ambulatory Visit (INDEPENDENT_AMBULATORY_CARE_PROVIDER_SITE_OTHER): Payer: Medicaid Other | Admitting: Plastic Surgery

## 2021-06-11 DIAGNOSIS — L910 Hypertrophic scar: Secondary | ICD-10-CM

## 2021-06-11 NOTE — Progress Notes (Signed)
? ?  Referring Provider ?Chales Salmon, MD ?[address] ?[place],  [place] [address]  ? ?CC: No chief complaint on file. ?   ? ?[name] is an 21 y.o. female.  ?HPI: Patient presents 8 weeks out from injection of scar in her left chin with Kenalog.  She feels like she is had a nice result with no side effects.  She is interested in another injection in that area. ? ?Review of Systems ?General: Denies fevers and chills ? ?Physical Exam ? ?  09/09/2020  ? 12:10 AM 09/08/2020  ?  8:15 PM 09/08/2020  ?  7:59 PM  ?Vitals with BMI  ?Systolic 149 131 482  ?Diastolic 75 73 55  ?Pulse 94 92 94  ?  ?General:  No acute distress,  Alert and oriented, Non-Toxic, Normal speech and affect ?Examination shows a palpable scar in the left chin area.  Its improved in height and contour after the injection of the steroid last time. ? ?Assessment/Plan ?Patient is reasonable candidate for second injection of Kenalog in the left chin scar.  We reviewed the risks and benefits including skin discoloration and she is interested in moving forward.  The area was prepped with an alcohol pad and 0.5 cc of Kenalog 40 mixed with 1 cc of lidocaine with epinephrine was infiltrated into the firmness areas of the scar trying not to inject to superficial to avoid skin discoloration.  She tolerated this fine.  We will plan to see her in around 8 weeks to check her progress.  All of her questions were answered. ? ?[name] ?06/11/2021, 10:39 AM  ? ? ?    ?

## 2021-06-25 ENCOUNTER — Telehealth: Payer: Self-pay | Admitting: Plastic Surgery

## 2021-06-25 NOTE — Telephone Encounter (Signed)
Pt is calling in stating that she would like to have a referral to a dermatologist for the scarring on her stomach and L thigh.  Dr. Arita Miss had talked to her about it and she had not heard anything back from the office concerning it.  Pt would like to have a call back. ?

## 2021-07-06 NOTE — Telephone Encounter (Signed)
Spoke with Dr. Arita Miss. It was not mentioned during office visit. However, patient came to office and wanted a referral. He is ok sending her where ever she would like to go. ?

## 2021-07-06 NOTE — Telephone Encounter (Signed)
I remember some one from our office came by and said that's what patient wanted.  I'm fine with that if that's what she wants.

## 2021-07-06 NOTE — Telephone Encounter (Signed)
Pt is calling back to check the status of the referral to the dermatologist.  Per French Ana she is waiting to hear back from Dr. Arita Miss with the information due to it not being in the notes that it was discussed. Pt would like to have a call back. ?

## 2021-07-15 ENCOUNTER — Other Ambulatory Visit: Payer: Self-pay

## 2021-07-15 DIAGNOSIS — L910 Hypertrophic scar: Secondary | ICD-10-CM

## 2021-08-13 ENCOUNTER — Ambulatory Visit (INDEPENDENT_AMBULATORY_CARE_PROVIDER_SITE_OTHER): Payer: Medicaid Other | Admitting: Plastic Surgery

## 2021-08-13 DIAGNOSIS — L910 Hypertrophic scar: Secondary | ICD-10-CM

## 2021-08-13 NOTE — Progress Notes (Signed)
   Referring Provider Chales Salmon, MD [address] [place],  [place] [address]   CC:  Chief Complaint  Patient presents with   Follow-up      [name] is an 21 y.o. female.  HPI: Patient presents for 56-month follow-up after steroid injection for keloids in her left chin and abdominal area.  She is overall very happy and feels like both areas of improved.  She is interested in a little bit more injection in her abdominal wall keloid to help 1 side of it go down a bit more.  Review of Systems General: Denies fevers and chills  Physical Exam    09/09/2020   12:10 AM 09/08/2020    8:15 PM 09/08/2020    7:59 PM  Vitals with BMI  Systolic 149 131 631  Diastolic 75 73 55  Pulse 94 92 94    General:  No acute distress,  Alert and oriented, Non-Toxic, Normal speech and affect Examination shows complete flattening of the raised scar in her left chin.  There is mild hypopigmentation to the skin but this is not particularly noticeable.  On the abdomen there is still some raised firmness to the keloid on the right side and the very mild area of hypopigmentation to the skin superior to the keloid.  Assessment/Plan We reviewed the risks and benefits of additional keloid injections.  She would like to proceed with that for the abdomen.  1 cc of Kenalog and 1.5 cc of lidocaine with epinephrine were drawn up and mixed together.  After prepping the abdomen with an alcohol pad the solution was infiltrated directly into the scar itself.  She tolerated this fine.  We will plan to see her in 2 months to check in.  All of her questions were answered.  Allena Napoleon 08/13/2021, 10:56 AM

## 2021-10-20 ENCOUNTER — Ambulatory Visit (INDEPENDENT_AMBULATORY_CARE_PROVIDER_SITE_OTHER): Payer: Medicaid Other | Admitting: Physician Assistant

## 2021-10-20 DIAGNOSIS — L910 Hypertrophic scar: Secondary | ICD-10-CM

## 2021-10-20 NOTE — Progress Notes (Signed)
   Referring Provider Chales Salmon, MD [address] [place],  [place] [address]   CC:  Chief Complaint  Patient presents with   Follow-up      [name] is an 21 y.o. female.  HPI: This is a 21 year old female seen in our office for follow-up evaluation status post steroid injection for keloids in her left chin and abdomen by Dr. Arita Miss.  Unfortunately approximately a year ago the patient was attacked by a dog, she had several lacerations including the left shin, abdomen and left thigh.  She was seen in our office and evaluated for these.  At that time they were injected with lidocaine with epinephrine and Kenalog.  She had subsequent follow-up visits in our office and when she had her left thigh keloid injected as well.  She notes that over time the keloids did significantly improve and flattened out.  She does note some hypopigmentation at the injection sites.  The patient requested a second opinion with dermatology and a referral was placed.  She notes that the dermatology office that she reached out to does not have an appointment till March.  Since her last office visit she denies any significant changes, she is overall happy with the improvement in the keloid but it is still concerned about the hypopigmentation.  She would like to have a referral placed for another dermatologist.  Review of Systems General: Positive for hypopigmentation  Physical Exam    09/09/2020   12:10 AM 09/08/2020    8:15 PM 09/08/2020    7:59 PM  Vitals with BMI  Systolic 149 131 824  Diastolic 75 73 55  Pulse 94 92 94    General:  No acute distress,  Alert and oriented, Non-Toxic, Normal speech and affect Hypopigmentation at the left chin with palpable scar, abdominal scar soft, left thigh scar soft with some hypopigmentation         Assessment/Plan   This is a 21 year-old female seen in our office for follow-up evaluation status post treatment of keloid scars.  Her keloids have significant  proved she does have some hypopigmentation particularly on the chin.  She would like a referral to dermatology for a second opinion.  She did have a dermatology office that she was hoping to go see and was originally referred to but did not want to travel that far.  I have placed referral to [facility] dermatology.  At this time the patient requires no further evaluation or management from a surgical perspective, she is welcome to return for any further questions or concerns she may have.  The patient verbalized understanding and agreement to today's plan had no further questions or concerns.  Kelle Darting Bellina Tokarczyk 10/20/2021, 1:12 PM

## 2021-10-21 ENCOUNTER — Ambulatory Visit: Payer: Medicaid Other | Admitting: Plastic Surgery

## 2021-10-22 ENCOUNTER — Ambulatory Visit: Payer: Medicaid Other | Admitting: Plastic Surgery

## 2021-10-23 ENCOUNTER — Ambulatory Visit: Payer: Medicaid Other | Admitting: Plastic Surgery

## 2022-04-30 ENCOUNTER — Telehealth: Payer: Self-pay | Admitting: Physician Assistant

## 2022-04-30 NOTE — Telephone Encounter (Signed)
Pt called saying she wants to see Merry Proud again because he made her feel comfortable. Pt says she is coming for the same reason as before- keloid scars.  Please advise if pt can schedule first appt with Merry Proud and have follow up with a provider. Her provider is no longer here. Pt phone [phone]

## 2022-04-30 NOTE — Telephone Encounter (Signed)
Im more than happy to see her, I should have availability next week. I can see her for follow up and speak with surgeons as needed.

## 2022-05-07 ENCOUNTER — Ambulatory Visit: Payer: Medicaid Other | Admitting: Physician Assistant

## 2022-05-11 ENCOUNTER — Ambulatory Visit: Payer: Medicaid Other | Admitting: Physician Assistant

## 2022-05-11 DIAGNOSIS — L910 Hypertrophic scar: Secondary | ICD-10-CM

## 2022-05-11 NOTE — Progress Notes (Signed)
   Referring Provider Harrie Jeans, MD [place],  [place] [address]   CC:  Chief Complaint  Patient presents with   Follow-up      [name] is an 22 y.o. female.   HPI: This is a 22 year old female seen in our office for follow-up evaluation status post steroid injections and keloid scarring of her abdomen.  The patient was originally seen by Dr. Claudia Desanctis after she was bitten by a dog, she had several lacerations that resulted in keloid scarring.  She was seen and had Kenalog injections which significantly helped with the consistency of the keloids but noted some hypopigmentation and thinning of the skin particularly on the abdomen.  She followed up with dermatology but they did not have any significant treatment options for the hypopigmentation.  Most recently she has noticed that the skin is much thinner than it was previously at the site of the injections and wanted to discuss further options.  Review of Systems General: Negative for fever  Physical Exam    09/09/2020   12:10 AM 09/08/2020    8:15 PM 09/08/2020    7:59 PM  Vitals with BMI  Systolic [id number] [id number]  Diastolic 75 73 55  Pulse 94 92 94    General:  No acute distress,  Alert and oriented, Non-Toxic, Normal speech and affect Hypopigmentation at the abdominal scar with thinning of the skin, no open wounds, no palpable   fluid collections  Assessment/Plan This is a 22 year old female seen in our office for evaluation of scar.  She would like to discuss revision options.  I will reach out to Dr. Lovena Le to see if he has any advice and if she may be candidate for revision.  I did have a lengthy discussion with her that given the scarring previously that any revision could result in similar if not worse scarring.  She understands this and would like to discuss the option further.  I will reach out to her once I have further recommendations.  [name] 05/11/2022, 3:04 PM

## 2022-06-10 ENCOUNTER — Encounter: Payer: Self-pay | Admitting: Plastic Surgery

## 2022-06-10 ENCOUNTER — Ambulatory Visit: Payer: Medicaid Other | Admitting: Plastic Surgery

## 2022-06-10 VITALS — BP 135/89 | HR 60 | Ht 69.0 in | Wt 282.4 lb

## 2022-06-10 DIAGNOSIS — L910 Hypertrophic scar: Secondary | ICD-10-CM

## 2022-06-10 NOTE — Progress Notes (Signed)
[name] is a 22 year old female who has been seen multiple times in our office for scars after being attacked by dog.  Today she is here to specifically discuss the scar on her abdomen which she would like to have revised.  The scar has been injected multiple times with steroids and she now has some hypopigmentation and skin thinning.  She would like revision of the scar to improve the appearance.  On physical exam she has a 6 x 3 scar with atrophy of the fat underneath the scar and thinning of the skin.  We discussed scar revision specifically revision in the office under local.  I believe that she will require a scar that will be approximately 8 to 12 cm in length to be able to close her wound primarily.  I discussed this with her at length.  She also understands that she will have a scar from the revision and that is entirely possible that she will develop a keloid again given the fact that she keloid it in multiple places after the dog attack.  She understands this as well.  Photographs were obtained today with her consent.  She will be scheduled for revision of the scar in the office under local.

## 2022-06-15 ENCOUNTER — Telehealth: Payer: Self-pay

## 2022-06-15 ENCOUNTER — Encounter: Payer: Self-pay | Admitting: *Deleted

## 2022-06-15 NOTE — Telephone Encounter (Signed)
Spoke with Bridgett with [employer]. Verified cpt 13101 and 78295 no PA required. OVSX would be $4 co-pay, OPSX $0 copay [phone]. Called patient, provided information-she would like to do OPSX. Explained her that although the surgery is no PA required, it is not a guarantee of payment. Patient understood and advised that this is liability due to a dog bite. Her personal insurance will pay, then subrogate with the other insurance who is liable. Waiting on route from Dr. Ladona Ridgel. Gave file to [employer].

## 2022-08-16 ENCOUNTER — Ambulatory Visit: Payer: Medicaid Other | Admitting: Plastic Surgery

## 2022-08-16 VITALS — BP 116/72 | HR 62

## 2022-08-16 DIAGNOSIS — Z719 Counseling, unspecified: Secondary | ICD-10-CM

## 2022-08-16 DIAGNOSIS — L905 Scar conditions and fibrosis of skin: Secondary | ICD-10-CM

## 2022-08-16 NOTE — Progress Notes (Signed)
Procedure Note  Preoperative Dx: Abdominal wall scar  Postoperative Dx: Same  Procedure: Scar revision  Anesthesia: Lidocaine 1% with 1:100,000 epinephrine and 0.25% Sensorcaine   Indication for Procedure: Improve the appearance of the scar  Description of Procedure: Risks and complications were explained to the patient including recurrent keloid.  Consent was confirmed and the patient understands the risks and benefits.  The potential complications and alternatives were explained and the patient consents.  The patient expressed understanding the option of not having the procedure and the risks of a scar.  Time out was called and all information was confirmed to be correct.    The area was prepped and drapped.  Local anesthetic was injected in the subcutaneous tissues.  After waiting for the local to take affect the thin skin and scar were excised sharply.  After obtaining hemostasis, the surgical wound was closed in layers with 3-0 Monocryl in the dermis and a running 4-0 Monocryl subcuticular stitch.  The surgical wound measured 12 cm.  A dressing was applied.  The patient was given instructions on how to care for the area and a follow up appointment.  [name] tolerated the procedure well and there were no complications .

## 2022-08-30 ENCOUNTER — Ambulatory Visit: Payer: Medicaid Other | Admitting: Physician Assistant

## 2022-08-30 DIAGNOSIS — L905 Scar conditions and fibrosis of skin: Secondary | ICD-10-CM

## 2022-08-30 NOTE — Progress Notes (Signed)
This is a 22 year old female seen in our office for follow-up evaluation status post scar revision on 08/16/2022 by Dr. Ladona Ridgel.  Postoperatively patient notes she is done well without significant issues.  She denies any infectious signs or symptoms.  She notes she is having some itchiness.  On exam anterior abdominal incision is clean dry and intact with routine healing, no surrounding redness or warmth.  Photo taken  Overall the patient is doing well.  I discussed continued massage, continued use of Vaseline.  I did discuss the scar cream options.  The patient will follow-up on a as needed basis.  She also may be reaching out to have another area excised in the future.  She was given strict return precautions.

## 2022-10-04 ENCOUNTER — Encounter: Payer: Self-pay | Admitting: Plastic Surgery

## 2022-10-04 ENCOUNTER — Ambulatory Visit: Payer: Medicaid Other | Admitting: Plastic Surgery

## 2022-10-04 VITALS — BP 130/77 | HR 74

## 2022-10-04 DIAGNOSIS — Z9889 Other specified postprocedural states: Secondary | ICD-10-CM

## 2022-10-04 NOTE — Progress Notes (Signed)
[name] returns today for postoperative evaluation of her abdominal wall scar.  It is healing well and she is happy with the outcome.  The left lateral portion of the incision is somewhat raised but does not appear to be be hypertrophic or keloid at this point.  We discussed strategies for management of the scar including continued massage and use of a silicone based scar cream or silicone tape.  We discussed where to obtain these items.  She will return to see me in 1 month.

## 2022-11-01 ENCOUNTER — Ambulatory Visit: Payer: Medicaid Other | Admitting: Plastic Surgery

## 2022-11-01 DIAGNOSIS — L905 Scar conditions and fibrosis of skin: Secondary | ICD-10-CM | POA: Diagnosis not present

## 2022-11-01 NOTE — Progress Notes (Signed)
[name] returns today for evaluation of the scar on her abdomen.  The patient underwent scar revision June 10.  Today she notes that the resulting scar is slightly raised over compared to her last visit.  We discussed the management and agreed to an injection with Kenalog 40.  Patient understands that there is a slight risk for skin thinning and potentially widening of the scar.  Procedure: Injection of the scar with Kenalog 40 and 1% plain lidocaine  Indication: Hypertrophic scar  Procedure: After prepping the skin with alcohol and obtaining the patient's consent the scar was injected with 1 mL of a 50-50 mixture of Kenalog 40 and 1% plain lidocaine.  The patient tolerated the procedure well there were no complications.     She will continue scar massage and will follow-up with me in 4 to 6 weeks.

## 2022-12-06 ENCOUNTER — Ambulatory Visit: Payer: Medicaid Other | Admitting: Plastic Surgery

## 2022-12-06 VITALS — BP 111/74 | HR 69

## 2022-12-06 DIAGNOSIS — L910 Hypertrophic scar: Secondary | ICD-10-CM

## 2022-12-06 DIAGNOSIS — L905 Scar conditions and fibrosis of skin: Secondary | ICD-10-CM

## 2022-12-06 NOTE — Progress Notes (Signed)
[name] returns today for evaluation.  I excised her abdominal scar  for her in June.  She has had 1 steroid injection since then.  She returns today happy with the overall appearance of the scar.  She is still doing scar massage.  She does note that the ends are both still slightly hypertrophic.  Is requesting a steroid injection.  Procedure: After confirming the patient and the procedure I cleaned the skin with alcohol.  The scar was injected with 0.4 mL of a 50-50 mixture of Kenalog 40 and 1% plain lidocaine.  The total length of the scar injected was approximately 1.5 cm.  There were no complications. We discussed the importance of continued scar massage.  She will also add scar tape to her scar.  She will follow-up with me as needed.

## 2023-03-15 ENCOUNTER — Other Ambulatory Visit: Payer: Self-pay

## 2023-03-15 ENCOUNTER — Encounter: Payer: Self-pay | Admitting: Allergy & Immunology

## 2023-03-15 ENCOUNTER — Ambulatory Visit: Payer: Medicaid Other | Admitting: Allergy & Immunology

## 2023-03-15 VITALS — BP 110/72 | HR 69 | Temp 98.0°F | Resp 12 | Ht 68.11 in | Wt 271.4 lb

## 2023-03-15 DIAGNOSIS — J3089 Other allergic rhinitis: Secondary | ICD-10-CM | POA: Diagnosis not present

## 2023-03-15 DIAGNOSIS — J4520 Mild intermittent asthma, uncomplicated: Secondary | ICD-10-CM

## 2023-03-15 DIAGNOSIS — J302 Other seasonal allergic rhinitis: Secondary | ICD-10-CM

## 2023-03-15 DIAGNOSIS — L505 Cholinergic urticaria: Secondary | ICD-10-CM

## 2023-03-15 DIAGNOSIS — L500 Allergic urticaria: Secondary | ICD-10-CM | POA: Diagnosis not present

## 2023-03-15 MED ORDER — CETIRIZINE HCL 10 MG PO TABS: 10.0000 mg | ORAL_TABLET | Freq: Two times a day (BID) | ORAL | 0 refills | Status: DC

## 2023-03-15 NOTE — Progress Notes (Signed)
 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 Mild intermittent asthma, uncomplicated - This seems to have resolved.  - We can send in some albuterol  if needed.   2. Chronic rhinitis - Testing today showed: ragweed, weeds, trees, cat, and dog - Copy of test results provided.  - Avoidance measures provided. - Start taking: Zyrtec  (cetirizine ) 10mg  tablet TWICE DAILY - You can use an extra dose of the antihistamine, if needed, for breakthrough symptoms.  - Consider nasal saline rinses 1-2 times daily to remove allergens from the nasal cavities as well as help with mucous clearance (this is especially helpful to do before the nasal sprays are given) - Consider allergy  shots as a means of long-term control. - Allergy  shots re-train and reset the immune system to ignore environmental allergens and decrease the resulting immune response to those allergens (sneezing, itchy watery eyes, runny nose, nasal congestion, etc).    - Allergy  shots improve symptoms in 75-85% of patients.  - We can discuss more at the next appointment if the medications are not working for you.  3. Cholinergic urticaria - This is typically caused by increasing body temperature (so periods like exercising or being in the heat in the summer tend to make it worse). - Your history does not have any red flags such as fevers, joint pains, or permanent skin changes that would be concerning for a more serious cause of hives.  - We will get some labs to rule out serious causes of hives: alpha gal panel, complete blood count,  tryptase level, chronic urticaria panel, CMP, ESR, and CRP. - Chronic hives are often times a self limited process and will burn themselves out over 6-12 months, although this is not always the case.  - In the meantime, start suppressive dosing of antihistamines:   - Morning: Zyrtec  (cetirizine ) 10mg    - Evening: Zyrtec  (cetirizine ) 10mg    - If the above is not working, try adding: Pepcid  (famotidine ) 20mg  TWICE DAILY - You can change this dosing at home, decreasing the dose as needed or increasing the dosing as needed.  - If you are not tolerating the medications or are tired of taking them every day, we can start treatment with a monthly injectable medication called Xolair.  - Watch for other signs including shortness of breath, coughing, stomach pain, or throat swelling that might be indicative of exercise induced anaphylaxis.   4. Return in about 8 weeks (around 05/10/2023). You can have the follow up appointment with Dr. Iva or a Nurse Practicioner (our Nurse Practitioners are excellent and always have Physician oversight!).    This note in its entirety was forwarded to the Provider who requested this consultation.  Subjective:   Shaneka Efaw is a 23 y.o. female presenting today for evaluation of  Chief Complaint  Patient presents with   Urticaria    States when she goes to the gym and sweats then she starts itching and breaks out in hives.   Pruritus    Jeronda Don has a history of the following: Patient Active Problem List   Diagnosis Date Noted   Gastroesophageal reflux  disease 06/30/2018   Anaphylactic shock due to adverse food reaction 03/17/2018   Seasonal and perennial allergic rhinitis 03/17/2018   Seasonal allergic conjunctivitis 10/17/2015   Mild intermittent asthma, uncomplicated 10/17/2015   ADHD (attention deficit hyperactivity disorder), inattentive type 07/09/2015   Developmental dysgraphia 07/09/2015   Obesity, Class III, BMI 40-49.9 (morbid obesity)  (HCC) 07/09/2015    History obtained from: chart review and patient.  Discussed the use of AI scribe software for clinical note transcription with the patient and/or guardian, who gave verbal consent to proceed.  Sherida Dobkins was referred by Sybil Hoose, MD.     Evalene is a 23 y.o. female presenting for re-establishment of care.  She was actually last seen in June 2021 by Arlean Mutter, one of our nurse practitioners.  At that time, she was doing fairly well.  For her asthma, she was continued on albuterol  as needed.  For her allergic rhinitis, she was continued on Zyrtec  as well as Nasonex  and Pataday .  She continue to avoid red dye containing foods.  EpiPen  was updated. Since last visit,  Since the last visit, she has done well.   Burnell, a biomedical scientist at [employer], presents with a recurring issue of hives and itching during exercise, particularly when sweating or overheating. The itching is described as intense enough to disrupt her gym activities, with episodes lasting approximately twenty minutes. The patient denies any associated throat swelling or skin changes post-episode, and the skin returns to normal after each occurrence. The patient also denies any associated fevers, joint pain, or swelling. The patient has not noticed a correlation between the hives and her Zyrtec  use for allergies.  Dasia has a history of allergies, with previous positive tests for dogs, grass, and molds. She reports a recent six-week period of exacerbated allergy  symptoms, but these have since improved. The patient takes Zyrtec  as needed, not daily, for allergy  management. She was never on allergy  shots. She had testing done in January 2020 that showed positives to indoor molds, dog, and dust mite.  She is open to repeat testing.  Lanyla also has a history of breathing issues, but denies any recent problems. She no longer has albuterol , indicating a lack of recent need. The patient denies any nighttime  coughing.  She was never on a controller medication.  The patient's symptoms of hives and itching appear to be more prevalent in the summer, but an episode was also reported in December. The patient denies any family history of autoimmune disease and reports a low intake of red meat. The patient has not noticed any correlation between her symptoms and her diet.     She did has a history of ADD and was on Strattera  for a period of time.  Otherwise, there is no history of other atopic diseases, including drug allergies, stinging insect allergies, or contact dermatitis. There is no significant infectious history. Vaccinations are up to date.    Past Medical History: Patient Active Problem List   Diagnosis Date Noted   Gastroesophageal reflux disease 06/30/2018   Anaphylactic shock due to adverse food reaction 03/17/2018   Seasonal and perennial allergic rhinitis 03/17/2018   Seasonal allergic conjunctivitis 10/17/2015   Mild intermittent asthma, uncomplicated 10/17/2015   ADHD (attention deficit hyperactivity disorder), inattentive type 07/09/2015   Developmental dysgraphia 07/09/2015   Obesity, Class III, BMI 40-49.9 (morbid obesity) (HCC) 07/09/2015    Medication List:  Allergies as of 03/15/2023       Reactions   Dust  Mite Extract    Molds & Smuts    Red Dye #40 (allura Red)    sweating        Medication List        Accurate as of March 15, 2023  1:07 PM. If you have any questions, ask your nurse or doctor.          busPIRone  5 MG tablet Commonly known as: BUSPAR  5 mg.   cetirizine  10 MG tablet Commonly known as: ZYRTEC  Take 1 tablet (10 mg total) by mouth 2 (two) times daily. What changed: when to take this Changed by: Marty Morton Shaggy   EPINEPHrine  0.3 mg/0.3 mL Soaj injection Commonly known as: EPI-PEN Inject 0.3 mLs (0.3 mg total) into the muscle as needed.   etonogestrel-ethinyl estradiol 0.12-0.015 MG/24HR vaginal ring Commonly known as:  NUVARING Place vaginally.   fluticasone  50 MCG/ACT nasal spray Commonly known as: FLONASE  Place into the nose.   ondansetron  4 MG disintegrating tablet Commonly known as: ZOFRAN -ODT Take 4 mg by mouth every 8 (eight) hours as needed.        Birth History: non-contributory  Developmental History: non-contributory  Past Surgical History: Past Surgical History:  Procedure Laterality Date   ADENOIDECTOMY  02/2014   EYE SURGERY     TONSILLECTOMY  02/2014   TONSILLECTOMY/ADENOIDECTOMY/TURBINATE REDUCTION  02/28/2012   Procedure: TONSILLECTOMY/ADENOIDECTOMY/TURBINATE REDUCTION;  Surgeon: Alm Bouche, MD;  Location: [facility];  Service: ENT;  Laterality: N/A;   TOOTH EXTRACTION N/A 12/27/2014   Procedure: DENTAL RESTORATION/EXTRACTIONS / Extracted impacted wisdom teeth numbers one, sixteen, seventeen, thirty-two.;  Surgeon: Glendia Primrose, DDS;  Location: [facility] OR;  Service: Oral Surgery;  Laterality: N/A;     Family History: Family History  Problem Relation Age of Onset   Allergic rhinitis Mother    Hypertension Mother    Diabetes Mother    Allergic rhinitis Father    Allergic rhinitis Brother    Allergic rhinitis Maternal Grandmother    Lung cancer Maternal Grandmother    Angioedema Neg Hx    Asthma Neg Hx    Eczema Neg Hx    Immunodeficiency Neg Hx    Urticaria Neg Hx      Social History: Azoria lives at home with her family.  She is one of ten children between her mother and father. She lives in a house that is 101 years old.  There is carpeting throughout the home.  She has electric heating and central cooling.  There are dogs inside of the home and cats outside of the home.  There are no dust mite covers on the bedding.  There is no tobacco exposure.  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  There is no fume, chemical, or dust exposure.  There is no HEPA filter in the home.   Review of systems otherwise negative other than  that mentioned in the HPI.    Objective:   Blood pressure 110/72, pulse 69, temperature 98 F (36.7 C), resp. rate 12, height 5' 8.11 (1.73 m), weight 271 lb 6.4 oz (123.1 kg), SpO2 98%. Body mass index is 41.13 kg/m.     Physical Exam Vitals reviewed.  Constitutional:      Appearance: She is well-developed and overweight.     Comments: Friendly and smiling.  HENT:     Head: Normocephalic and atraumatic.     Right Ear: Tympanic membrane, ear canal and external ear normal. No drainage, swelling or tenderness. Tympanic membrane  is not injected, scarred, erythematous, retracted or bulging.     Left Ear: Tympanic membrane, ear canal and external ear normal. No drainage, swelling or tenderness. Tympanic membrane is not injected, scarred, erythematous, retracted or bulging.     Nose: Rhinorrhea present. No nasal deformity, septal deviation or mucosal edema.     Right Turbinates: Enlarged, swollen and pale.     Left Turbinates: Enlarged, swollen and pale.     Right Sinus: No maxillary sinus tenderness or frontal sinus tenderness.     Left Sinus: No maxillary sinus tenderness or frontal sinus tenderness.     Mouth/Throat:     Mouth: Mucous membranes are not pale and not dry.     Pharynx: Uvula midline.  Eyes:     General:        Right eye: No discharge.        Left eye: No discharge.     Conjunctiva/sclera: Conjunctivae normal.     Right eye: Right conjunctiva is not injected. No chemosis.    Left eye: Left conjunctiva is not injected. No chemosis.    Pupils: Pupils are equal, round, and reactive to light.  Cardiovascular:     Rate and Rhythm: Normal rate and regular rhythm.     Heart sounds: Normal heart sounds.  Pulmonary:     Effort: Pulmonary effort is normal. No tachypnea, accessory muscle usage or respiratory distress.     Breath sounds: Normal breath sounds. No wheezing, rhonchi or rales.  Chest:     Chest wall: No tenderness.  Abdominal:     Tenderness: There is no  abdominal tenderness. There is no guarding or rebound.  Lymphadenopathy:     Head:     Right side of head: No submandibular, tonsillar or occipital adenopathy.     Left side of head: No submandibular, tonsillar or occipital adenopathy.     Cervical: No cervical adenopathy.  Skin:    General: Skin is warm.     Capillary Refill: Capillary refill takes less than 2 seconds.     Coloration: Skin is not pale.     Findings: No abrasion, erythema, petechiae or rash. Rash is not papular, urticarial or vesicular.     Comments: History of present.  Neurological:     Mental Status: She is alert.  Psychiatric:        Behavior: Behavior is cooperative.      Diagnostic studies:   Allergy  Studies:     Airborne Adult Perc - 03/15/23 1112     Time Antigen Placed 1050    Allergen Manufacturer Jestine    Location Back    Number of Test 55    1. Control-Buffer 50% Glycerol Negative    2. Control-Histamine 2+    3. Bahia Negative    4. Bermuda Negative    5. Johnson Negative    6. Kentucky  Blue Negative    7. Meadow Fescue Negative    8. Perennial Rye Negative    9. Timothy Negative    10. Ragweed Mix 2+    11. Cocklebur 2+    12. Plantain,  English Negative    13. Baccharis Negative    14. Dog Fennel Negative    15. Russian Thistle Negative    16. Lamb's Quarters Negative    17. Sheep Sorrell Negative    18. Rough Pigweed Negative    19. Marsh Elder, Rough Negative    20. Mugwort, Common Negative    21. Box, Elder Negative  22. Cedar, red Negative    23. Sweet Gum Negative    24. Pecan Pollen 2+    25. Pine Mix Negative    26. Walnut, Black Pollen Negative    27. Red Mulberry Negative    28. Ash Mix Negative    29. Birch Mix 2+    30. Beech American Negative    31. Cottonwood, Eastern Negative    32. Hickory, White 3+    33. Maple Mix 2+    34. Oak, Eastern Mix Negative    35. Sycamore Eastern Negative    36. Alternaria Alternata Negative    37. Cladosporium Herbarum  Negative    38. Aspergillus Mix Negative    39. Penicillium Mix Negative    40. Bipolaris Sorokiniana (Helminthosporium) Negative    41. Drechslera Spicifera (Curvularia) Negative    42. Mucor Plumbeus Negative    43. Fusarium Moniliforme Negative    44. Aureobasidium Pullulans (pullulara) Negative    45. Rhizopus Oryzae Negative    46. Botrytis Cinera Negative    47. Epicoccum Nigrum Negative    48. Phoma Betae Negative    49. Dust Mite Mix Negative    50. Cat Hair 10,000 BAU/ml 2+    51.  Dog Epithelia 2+    52. Mixed Feathers Negative    53. Horse Epithelia Negative    54. Cockroach, German Negative    55. Tobacco Leaf Negative             13 Food Perc - 03/15/23 1112       Test Information   Time Antigen Placed 1050    Allergen Manufacturer Greer    Location Back    Number of allergen test 13      Food   1. Peanut Negative    2. Soybean Negative    3. Wheat Negative    4. Sesame Negative    5. Milk, Cow Negative    6. Casein Negative    7. Egg White, Chicken Negative    8. Shellfish Mix Negative    9. Fish Mix Negative    10. Cashew Negative    11. Walnut Food Negative    12. Almond Negative    13. Hazelnut Negative             Allergy  testing results were read and interpreted by myself, documented by clinical staff.         Marty Shaggy, MD [facility] 

## 2023-03-15 NOTE — Patient Instructions (Addendum)
 1. Mild intermittent asthma, uncomplicated - This seems to have resolved.  - We can send in some albuterol  if needed.   2. Chronic rhinitis - Testing today showed: ragweed, weeds, trees, cat, and dog - Copy of test results provided.  - Avoidance measures provided. - Start taking: Zyrtec  (cetirizine ) 10mg  tablet TWICE DAILY - You can use an extra dose of the antihistamine, if needed, for breakthrough symptoms.  - Consider nasal saline rinses 1-2 times daily to remove allergens from the nasal cavities as well as help with mucous clearance (this is especially helpful to do before the nasal sprays are given) - Consider allergy  shots as a means of long-term control. - Allergy  shots re-train and reset the immune system to ignore environmental allergens and decrease the resulting immune response to those allergens (sneezing, itchy watery eyes, runny nose, nasal congestion, etc).    - Allergy  shots improve symptoms in 75-85% of patients.  - We can discuss more at the next appointment if the medications are not working for you.  3. Cholinergic urticaria - This is typically caused by increasing body temperature (so periods like exercising or being in the heat in the summer tend to make it worse). - Your history does not have any red flags such as fevers, joint pains, or permanent skin changes that would be concerning for a more serious cause of hives.  - We will get some labs to rule out serious causes of hives: alpha gal panel, complete blood count, tryptase level, chronic urticaria panel, CMP, ESR, and CRP. - Chronic hives are often times a self limited process and will burn themselves out over 6-12 months, although this is not always the case.  - In the meantime, start suppressive dosing of antihistamines:   - Morning: Zyrtec  (cetirizine ) 10mg    - Evening: Zyrtec  (cetirizine ) 10mg    - If the above is not working, try adding: Pepcid  (famotidine ) 20mg  TWICE DAILY - You can change this dosing  at home, decreasing the dose as needed or increasing the dosing as needed.  - If you are not tolerating the medications or are tired of taking them every day, we can start treatment with a monthly injectable medication called Xolair.  - Watch for other signs including shortness of breath, coughing, stomach pain, or throat swelling that might be indicative of exercise induced anaphylaxis.   4. Return in about 8 weeks (around 05/10/2023). You can have the follow up appointment with Dr. Iva or a Nurse Practicioner (our Nurse Practitioners are excellent and always have Physician oversight!).    Please inform us  of any Emergency Department visits, hospitalizations, or changes in symptoms. Call us  before going to the ED for breathing or allergy  symptoms since we might be able to fit you in for a sick visit. Feel free to contact us  anytime with any questions, problems, or concerns.  It was a pleasure to meet you today!  Websites that have reliable patient information: 1. American Academy of Asthma, Allergy , and Immunology: www.aaaai.org 2. Food Allergy  Research and Education (FARE): foodallergy.org 3. Mothers of Asthmatics: http://www.asthmacommunitynetwork.org 4. American College of Allergy , Asthma, and Immunology: www.acaai.org      "Like" us  on Facebook and Instagram for our latest updates!      A healthy democracy works best when [employer] participate! Make sure you are registered to vote! If you have moved or changed any of your contact information, you will need to get this updated before voting! Scan the QR codes below to learn more!  Airborne Adult Perc - 03/15/23 1112     Time Antigen Placed 1050    Allergen Manufacturer Jestine    Location Back    Number of Test 55    1. Control-Buffer 50% Glycerol Negative    2. Control-Histamine 2+    3. Bahia Negative    4. Bermuda Negative    5. Johnson Negative    6. Kentucky  Blue Negative    7. Meadow Fescue Negative     8. Perennial Rye Negative    9. Timothy Negative    10. Ragweed Mix 2+    11. Cocklebur 2+    12. Plantain,  English Negative    13. Baccharis Negative    14. Dog Fennel Negative    15. Russian Thistle Negative    16. Lamb's Quarters Negative    17. Sheep Sorrell Negative    18. Rough Pigweed Negative    19. Marsh Elder, Rough Negative    20. Mugwort, Common Negative    21. Box, Elder Negative    22. Cedar, red Negative    23. Sweet Gum Negative    24. Pecan Pollen 2+    25. Pine Mix Negative    26. Walnut, Black Pollen Negative    27. Red Mulberry Negative    28. Ash Mix Negative    29. Birch Mix 2+    30. Beech American Negative    31. Cottonwood, Eastern Negative    32. Hickory, White 3+    33. Maple Mix 2+    34. Oak, Eastern Mix Negative    35. Sycamore Eastern Negative    36. Alternaria Alternata Negative    37. Cladosporium Herbarum Negative    38. Aspergillus Mix Negative    39. Penicillium Mix Negative    40. Bipolaris Sorokiniana (Helminthosporium) Negative    41. Drechslera Spicifera (Curvularia) Negative    42. Mucor Plumbeus Negative    43. Fusarium Moniliforme Negative    44. Aureobasidium Pullulans (pullulara) Negative    45. Rhizopus Oryzae Negative    46. Botrytis Cinera Negative    47. Epicoccum Nigrum Negative    48. Phoma Betae Negative    49. Dust Mite Mix Negative    50. Cat Hair 10,000 BAU/ml 2+    51.  Dog Epithelia 2+    52. Mixed Feathers Negative    53. Horse Epithelia Negative    54. Cockroach, German Negative    55. Tobacco Leaf Negative             13 Food Perc - 03/15/23 1112       Test Information   Time Antigen Placed 1050    Allergen Manufacturer Greer    Location Back    Number of allergen test 13      Food   1. Peanut Negative    2. Soybean Negative    3. Wheat Negative    4. Sesame Negative    5. Milk, Cow Negative    6. Casein Negative    7. Egg White, Chicken Negative    8. Shellfish Mix Negative    9.  Fish Mix Negative    10. Cashew Negative    11. Walnut Food Negative    12. Almond Negative    13. Hazelnut Negative

## 2023-03-17 LAB — ALPHA-GAL PANEL
Allergen Lamb IgE: <0.1 kU/L
Beef IgE: <0.1 kU/L
IgE (Immunoglobulin E), Serum: 51 [IU]/mL (ref 6–495)
O215-IgE Alpha-Gal: <0.1 kU/L
Pork IgE: <0.1 kU/L

## 2023-03-18 ENCOUNTER — Encounter: Payer: Self-pay | Admitting: Allergy & Immunology

## 2023-03-18 LAB — ALLERGEN, RED (CARMINE) DYE, RF340: F340-IgE Carmine Red Dye: <0.1 kU/L

## 2023-03-30 LAB — CMP14+EGFR
ALT: 19 [IU]/L (ref 0–32)
AST: 17 [IU]/L (ref 0–40)
Albumin: 4.3 g/dL (ref 4.0–5.0)
Alkaline Phosphatase: 55 [IU]/L (ref 44–121)
BUN/Creatinine Ratio: 13 (ref 9–23)
BUN: 10 mg/dL (ref 6–20)
Bilirubin Total: 0.6 mg/dL (ref 0.0–1.2)
CO2: 24 mmol/L (ref 20–29)
Calcium: 9.4 mg/dL (ref 8.7–10.2)
Chloride: 103 mmol/L (ref 96–106)
Creatinine, Ser: 0.75 mg/dL (ref 0.57–1.00)
Globulin, Total: 2.9 g/dL (ref 1.5–4.5)
Glucose: 93 mg/dL (ref 70–99)
Potassium: 4.3 mmol/L (ref 3.5–5.2)
Sodium: 139 mmol/L (ref 134–144)
Total Protein: 7.2 g/dL (ref 6.0–8.5)
eGFR: 115 mL/min/{1.73_m2} (ref >59)

## 2023-03-30 LAB — ANTINUCLEAR ANTIBODIES, IFA: ANA Titer 1: NEGATIVE

## 2023-03-30 LAB — CBC WITH DIFFERENTIAL/PLATELET
Basophils Absolute: 0 10*3/uL (ref 0.0–0.2)
Basos: 1 %
EOS (ABSOLUTE): 0.1 10*3/uL (ref 0.0–0.4)
Eos: 2 %
Hematocrit: 41.6 % (ref 34.0–46.6)
Hemoglobin: 14 g/dL (ref 11.1–15.9)
Immature Grans (Abs): 0 10*3/uL (ref 0.0–0.1)
Immature Granulocytes: 0 %
Lymphocytes Absolute: 2.1 10*3/uL (ref 0.7–3.1)
Lymphs: 55 %
MCH: 30.6 pg (ref 26.6–33.0)
MCHC: 33.7 g/dL (ref 31.5–35.7)
MCV: 91 fL (ref 79–97)
Monocytes Absolute: 0.3 10*3/uL (ref 0.1–0.9)
Monocytes: 7 %
Neutrophils Absolute: 1.3 10*3/uL — ABNORMAL LOW (ref 1.4–7.0)
Neutrophils: 35 %
Platelets: 258 10*3/uL (ref 150–450)
RBC: 4.57 x10E6/uL (ref 3.77–5.28)
RDW: 14.6 % (ref 11.7–15.4)
WBC: 3.8 10*3/uL (ref 3.4–10.8)

## 2023-03-30 LAB — C-REACTIVE PROTEIN: CRP: <1 mg/L (ref 0–10)

## 2023-03-30 LAB — TRYPTASE: Tryptase: 2.6 ug/L (ref 2.2–13.2)

## 2023-03-30 LAB — CHRONIC URTICARIA: cu index: <3.1 (ref <10)

## 2023-03-30 LAB — THYROID ANTIBODIES
Thyroglobulin Antibody: <1 [IU]/mL (ref 0.0–0.9)
Thyroperoxidase Ab SerPl-aCnc: 16 [IU]/mL (ref 0–34)

## 2023-03-30 LAB — SEDIMENTATION RATE: Sed Rate: 11 mm/h (ref 0–32)

## 2023-05-24 ENCOUNTER — Ambulatory Visit: Payer: Medicaid Other | Admitting: Allergy & Immunology

## 2023-06-07 ENCOUNTER — Ambulatory Visit (INDEPENDENT_AMBULATORY_CARE_PROVIDER_SITE_OTHER): Payer: Medicaid Other | Admitting: Allergy & Immunology

## 2023-06-07 ENCOUNTER — Other Ambulatory Visit: Payer: Self-pay

## 2023-06-07 ENCOUNTER — Encounter: Payer: Self-pay | Admitting: Allergy & Immunology

## 2023-06-07 VITALS — BP 108/72 | HR 68 | Temp 97.2°F | Resp 16

## 2023-06-07 DIAGNOSIS — J302 Other seasonal allergic rhinitis: Secondary | ICD-10-CM

## 2023-06-07 DIAGNOSIS — J4520 Mild intermittent asthma, uncomplicated: Secondary | ICD-10-CM

## 2023-06-07 DIAGNOSIS — J3089 Other allergic rhinitis: Secondary | ICD-10-CM | POA: Diagnosis not present

## 2023-06-07 DIAGNOSIS — L505 Cholinergic urticaria: Secondary | ICD-10-CM

## 2023-06-07 MED ORDER — CETIRIZINE HCL 10 MG PO TABS: 10.0000 mg | ORAL_TABLET | Freq: Two times a day (BID) | ORAL | 1 refills | Status: DC

## 2023-06-07 MED ORDER — FAMOTIDINE 20 MG PO TABS: 20.0000 mg | ORAL_TABLET | Freq: Two times a day (BID) | ORAL | 1 refills | Status: AC

## 2023-06-07 NOTE — Progress Notes (Signed)
 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 Mild intermittent asthma, uncomplicated - This seems to have resolved.  - We can send in some albuterol if needed.   2. Chronic rhinitis - Testing aet the last visit showed: ragweed, weeds, trees, cat, and dog - Copy of test results provided.  - Avoidance measures provided. - Continue taking: Zyrtec (cetirizine) 10mg  tablet TWICE DAILY - ADD ON Mucinex twice daily to help with breaking up the mucous.  - Consider nasal saline rinses 1-2 times daily to remove allergens from the nasal cavities as well as help with mucous clearance (this is especially helpful to do before the nasal sprays are given) - Consider allergy shots as a means of long-term control. - Allergy shots "re-train" and "reset" the immune system to ignore environmental allergens and decrease the resulting immune response to those allergens (sneezing, itchy watery eyes, runny nose, nasal congestion, etc).    - Allergy shots improve symptoms in 75-85% of patients.  - CPT codes provided to see if you want to start that (we could do one vial and one injection weekly).  3. Cholinergic urticaria - Chronic hives are often times a self limited process and will "burn themselves out" over 6-12 months, although this is not always the case.  - In the meantime, start suppressive dosing of antihistamines, but add on the Pepcid:   - Morning: Zyrtec (cetirizine) 10mg  + Pepcid (famotidine) 20mg   - Evening: Zyrtec (cetirizine) 10mg  + Pepcid (famotidine) 20mg  - You can change this dosing at home, decreasing the dose as needed or  increasing the dosing as needed.  - If you are not tolerating the medications or are tired of taking them every day, we can start treatment with a monthly injectable medication called Xolair.   4. Return in about 4 months (around 10/07/2023). You can have the follow up appointment with Dr. Dellis Anes or a Nurse Practicioner (our Nurse Practitioners are excellent and always have Physician oversight!).     Subjective:   [name] is a 23 y.o. female presenting today for follow up of  Chief Complaint  Patient presents with   Allergies   Pruritus    [name] has a history of the following: Patient Active Problem List   Diagnosis Date Noted   Gastroesophageal reflux disease 06/30/2018   Anaphylactic shock due to adverse food reaction 03/17/2018   Seasonal and perennial allergic rhinitis 03/17/2018   Seasonal allergic conjunctivitis 10/17/2015   Mild intermittent asthma, uncomplicated 10/17/2015   ADHD (attention deficit hyperactivity disorder), inattentive type 07/09/2015   Developmental dysgraphia 07/09/2015   Obesity, Class III, BMI 40-49.9 (morbid obesity) (HCC) 07/09/2015    History obtained from: chart review and patient.  Discussed the use of AI scribe software for clinical note transcription with the patient and/or guardian, who gave verbal consent to proceed.  [name] is a 23 y.o. female presenting for a follow up visit.  She was last seen in January 2025.  At that time, she had testing that was positive to ragweed, weeds, trees, cat, and dog.  We started her on Zyrtec twice daily.  For her asthma,  she was doing well without albuterol.  For her urticaria, we started her on the Zyrtec twice a day and talked about adding Pepcid twice daily if this did not help.  Since last visit, she has mostly done well.   She has been experiencing persistent hives and intense itching for the past two to three weeks, occurring at least once a week. The hives and itching are not limited to  specific locations or activities and can occur while at work or driving. The itching is particularly intense on her feet, and the hives resolve on their own if she refrains from scratching.  Additionally, she describes an unusual symptom of a 'funny taste' in her mouth, associated with a buildup of thick phlegm that forms a 'little disc.' This symptom has been occurring for the past two to three weeks as well. She has been taking cetirizine (Zyrtec) 5 mg twice a day for her symptoms, which she believes helps with her allergy symptoms, although she still experiences sneezing. No cough or runny nose is reported.  She mentions a possible trigger for her symptoms being exposure to pollen, noting that her symptoms began after a trip to [facility] where pollen levels were high. She also notes that pollen levels have been high in her home area since her return.  She is open to allergy shots, but she would like to try some medication changes first.   Otherwise, there have been no changes to her past medical history, surgical history, family history, or social history.    Review of systems otherwise negative other than that mentioned in the HPI.    Objective:   Blood pressure 108/72, pulse 68, temperature (!) 97.2 F (36.2 C), temperature source Temporal, resp. rate 16, SpO2 98%. There is no height or weight on file to calculate BMI.    Physical Exam Vitals reviewed.  Constitutional:      Appearance: She is well-developed and overweight.     Comments: Friendly and smiling.  HENT:     Head: Normocephalic and atraumatic.     Right Ear: Tympanic membrane, ear canal and external ear normal. No drainage, swelling or tenderness. Tympanic membrane is not injected, scarred, erythematous, retracted or bulging.     Left Ear: Tympanic membrane, ear canal and external ear normal. No drainage, swelling or tenderness. Tympanic membrane is not injected, scarred, erythematous, retracted or bulging.      Nose: Rhinorrhea present. No nasal deformity, septal deviation or mucosal edema.     Right Turbinates: Enlarged, swollen and pale.     Left Turbinates: Enlarged, swollen and pale.     Right Sinus: No maxillary sinus tenderness or frontal sinus tenderness.     Left Sinus: No maxillary sinus tenderness or frontal sinus tenderness.     Comments: No polyps noted.     Mouth/Throat:     Lips: Pink.     Mouth: Mucous membranes are moist. Mucous membranes are not pale and not dry.     Pharynx: Uvula midline.     Comments: Minimal cobblestoning noted.  Eyes:     General:        Right eye: No discharge.        Left eye: No discharge.     Conjunctiva/sclera: Conjunctivae normal.     Right eye: Right conjunctiva is not injected. No chemosis.    Left eye: Left conjunctiva is not injected. No chemosis.    Pupils: Pupils are equal, round, and reactive to light.  Cardiovascular:  Rate and Rhythm: Normal rate and regular rhythm.     Heart sounds: Normal heart sounds.  Pulmonary:     Effort: Pulmonary effort is normal. No tachypnea, accessory muscle usage or respiratory distress.     Breath sounds: Normal breath sounds. No wheezing, rhonchi or rales.     Comments: Moving air well in all lung fields. No increased work of breathing noted.  Chest:     Chest wall: No tenderness.  Abdominal:     Tenderness: There is no abdominal tenderness. There is no guarding or rebound.  Lymphadenopathy:     Head:     Right side of head: No submandibular, tonsillar or occipital adenopathy.     Left side of head: No submandibular, tonsillar or occipital adenopathy.     Cervical: No cervical adenopathy.  Skin:    General: Skin is warm.     Capillary Refill: Capillary refill takes less than 2 seconds.     Coloration: Skin is not pale.     Findings: No abrasion, erythema, petechiae or rash. Rash is not papular, urticarial or vesicular.     Comments: No eczematous lesions noted.   Neurological:     Mental Status:  She is alert.  Psychiatric:        Behavior: Behavior is cooperative.      Diagnostic studies: none       Malachi Bonds, MD  [facility]

## 2023-06-07 NOTE — Patient Instructions (Addendum)
 1. Mild intermittent asthma, uncomplicated - This seems to have resolved.  - We can send in some albuterol if needed.   2. Chronic rhinitis - Testing aet the last visit showed: ragweed, weeds, trees, cat, and dog - Copy of test results provided.  - Avoidance measures provided. - Continue taking: Zyrtec (cetirizine) 10mg  tablet TWICE DAILY - ADD ON Mucinex twice daily to help with breaking up the mucous.  - Consider nasal saline rinses 1-2 times daily to remove allergens from the nasal cavities as well as help with mucous clearance (this is especially helpful to do before the nasal sprays are given) - Consider allergy shots as a means of long-term control. - Allergy shots "re-train" and "reset" the immune system to ignore environmental allergens and decrease the resulting immune response to those allergens (sneezing, itchy watery eyes, runny nose, nasal congestion, etc).    - Allergy shots improve symptoms in 75-85% of patients.  - CPT codes provided to see if you want to start that (we could do one vial and one injection weekly).  3. Cholinergic urticaria - Chronic hives are often times a self limited process and will "burn themselves out" over 6-12 months, although this is not always the case.  - In the meantime, start suppressive dosing of antihistamines, but add on the Pepcid:   - Morning: Zyrtec (cetirizine) 10mg  + Pepcid (famotidine) 20mg   - Evening: Zyrtec (cetirizine) 10mg  + Pepcid (famotidine) 20mg  - You can change this dosing at home, decreasing the dose as needed or increasing the dosing as needed.  - If you are not tolerating the medications or are tired of taking them every day, we can start treatment with a monthly injectable medication called Xolair.   4. Return in about 4 months (around 10/07/2023). You can have the follow up appointment with Dr. Dellis Anes or a Nurse Practicioner (our Nurse Practitioners are excellent and always have Physician oversight!).    Please inform us  of any Emergency Department visits, hospitalizations, or changes in symptoms. Call us before going to the ED for breathing or allergy symptoms since we might be able to fit you in for a sick visit. Feel free to contact us anytime with any questions, problems, or concerns.  It was a pleasure to see you again today!  Websites that have reliable patient information: 1. American Academy of Asthma, Allergy, and Immunology: www.aaaai.org 2. Food Allergy Research and Education (FARE): foodallergy.org 3. Mothers of Asthmatics: http://www.asthmacommunitynetwork.org 4. American College of Allergy, Asthma, and Immunology: www.acaai.org      "Like" [place] on Facebook and Instagram for our latest updates!      A healthy democracy works best when [employer] participate! Make sure you are registered to vote! If you have moved or changed any of your contact information, you will need to get this updated before voting! Scan the QR codes below to learn more!      Allergy Shots  Allergies are the result of a chain reaction that starts in the immune system. Your immune system controls how your body defends itself. For instance, if you have an allergy to pollen, your immune system identifies pollen as an invader or allergen. Your immune system overreacts by producing antibodies called Immunoglobulin E (IgE). These antibodies travel to cells that release chemicals, causing an allergic reaction.  The concept behind allergy immunotherapy, whether it is received in the form of shots or tablets, is that the immune system can be desensitized to specific allergens that trigger allergy symptoms. Although  it requires time and patience, the payback can be long-term relief. Allergy injections contain a dilute solution of those substances that you are allergic to based upon your skin testing and allergy history.   How Do Allergy Shots Work?  Allergy shots work much like a vaccine. Your body responds to injected amounts of  a particular allergen given in increasing doses, eventually developing a resistance and tolerance to it. Allergy shots can lead to decreased, minimal or no allergy symptoms.  There generally are two phases: build-up and maintenance. Build-up often ranges from three to six months and involves receiving injections with increasing amounts of the allergens. The shots are typically given once or twice a week, though more rapid build-up schedules are sometimes used.  The maintenance phase begins when the most effective dose is reached. This dose is different for each person, depending on how allergic you are and your response to the build-up injections. Once the maintenance dose is reached, there are longer periods between injections, typically two to four weeks.  Occasionally doctors give cortisone-type shots that can temporarily reduce allergy symptoms. These types of shots are different and should not be confused with allergy immunotherapy shots.  Who Can Be Treated with Allergy Shots?  Allergy shots may be a good treatment approach for people with allergic rhinitis (hay fever), allergic asthma, conjunctivitis (eye allergy) or stinging insect allergy.   Before deciding to begin allergy shots, you should consider:   The length of allergy season and the severity of your symptoms  Whether medications and/or changes to your environment can control your symptoms  Your desire to avoid long-term medication use  Time: allergy immunotherapy requires a major time commitment  Cost: may vary depending on your insurance coverage  Allergy shots for children age 23 and older are effective and often well tolerated. They might prevent the onset of new allergen sensitivities or the progression to asthma.  Allergy shots are not started on patients who are pregnant but can be continued on patients who become pregnant while receiving them. In some patients with other medical conditions or who take certain common  medications, allergy shots may be of risk. It is important to mention other medications you talk to your allergist.   What are the two types of build-ups offered:   RUSH or Rapid Desensitization -- one day of injections lasting from 8:30-4:30pm, injections every 1 hour.  Approximately half of the build-up process is completed in that one day.  The following week, normal build-up is resumed, and this entails ~16 visits either weekly or twice weekly, until reaching your "maintenance dose" which is continued weekly until eventually getting spaced out to every month for a duration of 3 to 5 years. The regular build-up appointments are nurse visits where the injections are administered, followed by required monitoring for 30 minutes.    Traditional build-up -- weekly visits for 6 -12 months until reaching "maintenance dose", then continue weekly until eventually spacing out to every 4 weeks as above. At these appointments, the injections are administered, followed by required monitoring for 30 minutes.     Either way is acceptable, and both are equally effective. With the rush protocol, the advantage is that less time is spent here for injections overall AND you would also reach maintenance dosing faster (which is when the clinical benefit starts to become more apparent). Not everyone is a candidate for rapid desensitization.   IF we proceed with the RUSH protocol, there are premedications which must be taken  the day before and the day after the rush only (this includes antihistamines, steroids, and Singulair).  After the rush day, no prednisone or Singulair is required, and we just recommend antihistamines taken on your injection day.  What Is An Estimate of the Costs?  If you are interested in starting allergy injections, please check with your insurance company about your coverage for both allergy vial sets and allergy injections.  Please do so prior to making the appointment to start injections.  The  following are CPT codes to give to your insurance company. These are the amounts we BILL to the insurance company, but the amount YOU WILL PAY and WE RECEIVE IS SUBSTANTIALLY LESS and depends on the contracts we have with different insurance companies.   Amount Billed to Insurance One allergy vial set  CPT 95165   $ 1200     One injection   CPT 95115   $ 35  RUSH (Rapid Desensitization) CPT 95180 x 8 hours  $500/hour  Regarding the allergy injections, your co-pay may or may not apply with each injection, so please confirm this with your insurance company. When you start allergy injections, 1 or 2 sets of vials are made based on your allergies.  Not all patients can be on one set of vials. A set of vials lasts 6 months to a year depending on how quickly you can proceed with your build-up of your allergy injections. Vials are personalized for each patient depending on their specific allergens.  How often are allergy injection given during the build-up period?   Injections are given at least weekly during the build-up period until your maintenance dose is achieved. Per the doctor's discretion, you may have the option of getting allergy injections two times per week during the build-up period. However, there must be at least 48 hours between injections. The build-up period is usually completed within 6-12 months depending on your ability to schedule injections and for adjustments for reactions. When maintenance dose is reached, your injection schedule is gradually changed to every two weeks and later to every three weeks. Injections will then continue every 4 weeks. Usually, injections are continued for a total of 3-5 years.   When Will I Feel Better?  Some may experience decreased allergy symptoms during the build-up phase. For others, it may take as long as 12 months on the maintenance dose. If there is no improvement after a year of maintenance, your allergist will discuss other treatment options with  you.  If you aren't responding to allergy shots, it may be because there is not enough dose of the allergen in your vaccine or there are missing allergens that were not identified during your allergy testing. Other reasons could be that there are high levels of the allergen in your environment or major exposure to non-allergic triggers like tobacco smoke.  What Is the Length of Treatment?  Once the maintenance dose is reached, allergy shots are generally continued for three to five years. The decision to stop should be discussed with your allergist at that time. Some people may experience a permanent reduction of allergy symptoms. Others may relapse and a longer course of allergy shots can be considered.  What Are the Possible Reactions?  The two types of adverse reactions that can occur with allergy shots are local and systemic. Common local reactions include very mild redness and swelling at the injection site, which can happen immediately or several hours after. Report a delayed reaction from your last injection.  These include arm swelling or runny nose, watery eyes or cough that occurs within 12-24 hours after injection. A systemic reaction, which is less common, affects the entire body or a particular body system. They are usually mild and typically respond quickly to medications. Signs include increased allergy symptoms such as sneezing, a stuffy nose or hives.   Rarely, a serious systemic reaction called anaphylaxis can develop. Symptoms include swelling in the throat, wheezing, a feeling of tightness in the chest, nausea or dizziness. Most serious systemic reactions develop within 30 minutes of allergy shots. This is why it is strongly recommended you wait in your doctor's office for 30 minutes after your injections. Your allergist is trained to watch for reactions, and his or her staff is trained and equipped with the proper medications to identify and treat them.   Report to the nurse  immediately if you experience any of the following symptoms: swelling, itching or redness of the skin, hives, watery eyes/nose, breathing difficulty, excessive sneezing, coughing, stomach pain, diarrhea, or light headedness. These symptoms may occur within 15-20 minutes after injection and may require medication.   Who Should Administer Allergy Shots?  The preferred location for receiving shots is your prescribing allergist's office. Injections can sometimes be given at another facility where the physician and staff are trained to recognize and treat reactions, and have received instructions by your prescribing allergist.  What if I am late for an injection?   Injection dose will be adjusted depending upon how many days or weeks you are late for your injection.   What if I am sick?   Please report any illness to the nurse before receiving injections. She may adjust your dose or postpone injections depending on your symptoms. If you have fever, flu, sinus infection or chest congestion it is best to postpone allergy injections until you are better. Never get an allergy injection if your asthma is causing you problems. If your symptoms persist, seek out medical care to get your health problem under control.  What If I am or Become Pregnant:  Women that become pregnant should schedule an appointment with [facility] before receiving any further allergy injections.

## 2023-06-08 ENCOUNTER — Telehealth: Payer: Self-pay

## 2023-06-08 NOTE — Telephone Encounter (Signed)
 Returned call and answered questions about past lab results

## 2023-10-11 ENCOUNTER — Ambulatory Visit (INDEPENDENT_AMBULATORY_CARE_PROVIDER_SITE_OTHER): Payer: Self-pay | Admitting: Allergy & Immunology

## 2023-10-11 ENCOUNTER — Encounter: Payer: Self-pay | Admitting: Allergy & Immunology

## 2023-10-11 ENCOUNTER — Other Ambulatory Visit: Payer: Self-pay

## 2023-10-11 VITALS — BP 118/68 | HR 86 | Temp 97.9°F | Resp 18

## 2023-10-11 DIAGNOSIS — J302 Other seasonal allergic rhinitis: Secondary | ICD-10-CM

## 2023-10-11 DIAGNOSIS — J3089 Other allergic rhinitis: Secondary | ICD-10-CM | POA: Diagnosis not present

## 2023-10-11 DIAGNOSIS — L505 Cholinergic urticaria: Secondary | ICD-10-CM | POA: Diagnosis not present

## 2023-10-11 MED ORDER — CETIRIZINE HCL 10 MG PO TABS: 10.0000 mg | ORAL_TABLET | Freq: Two times a day (BID) | ORAL | 3 refills | Status: AC

## 2023-10-11 NOTE — Patient Instructions (Addendum)
 1. Chronic rhinitis - Testing at the last visit showed: ragweed, weeds, trees, cat, and dog - Continue taking: Zyrtec  (cetirizine ) 10mg  tablet TWICE DAILY - ADD ON Mucinex twice daily to help with breaking up the mucous as needed.  - Consider nasal saline rinses 1-2 times daily to remove allergens from the nasal cavities as well as help with mucous clearance (this is especially helpful to do before the nasal sprays are given) - We can hold off on allergy  shots for long term control.  2. Cholinergic urticaria - Chronic hives are often times a self limited process and will burn themselves out over 6-12 months, although this is not always the case.  - In the meantime, start suppressive dosing of antihistamines, but add on the Pepcid :   - Morning: Zyrtec  (cetirizine ) 10mg  + Pepcid  (famotidine ) 20mg   - Evening: Zyrtec  (cetirizine ) 10mg  + Pepcid  (famotidine ) 20mg  - You can change this dosing at home, decreasing the dose as needed or increasing the dosing as needed.  - If you are not tolerating the medications or are tired of taking them every day, we can start treatment with a monthly injectable medication called Xolair.  - But you seem to have this under excellent control.  4. Return in about 1 year (around 10/10/2024). You can have the follow up appointment with Dr. Iva or a Nurse Practicioner (our Nurse Practitioners are excellent and always have Physician oversight!).    Please inform us  of any Emergency Department visits, hospitalizations, or changes in symptoms. Call us  before going to the ED for breathing or allergy  symptoms since we might be able to fit you in for a sick visit. Feel free to contact us  anytime with any questions, problems, or concerns.  It was a pleasure to see you again today!  Websites that have reliable patient information: 1. American Academy of Asthma, Allergy , and Immunology: www.aaaai.org 2. Food Allergy  Research and Education (FARE): foodallergy.org 3.  Mothers of Asthmatics: http://www.asthmacommunitynetwork.org 4. American College of Allergy , Asthma, and Immunology: www.acaai.org      "Like" us  on Facebook and Instagram for our latest updates!      A healthy democracy works best when [employer] participate! Make sure you are registered to vote! If you have moved or changed any of your contact information, you will need to get this updated before voting! Scan the QR codes below to learn more!      Allergy  Shots  Allergies are the result of a chain reaction that starts in the immune system. Your immune system controls how your body defends itself. For instance, if you have an allergy  to pollen, your immune system identifies pollen as an invader or allergen. Your immune system overreacts by producing antibodies called Immunoglobulin E (IgE). These antibodies travel to cells that release chemicals, causing an allergic reaction.  The concept behind allergy  immunotherapy, whether it is received in the form of shots or tablets, is that the immune system can be desensitized to specific allergens that trigger allergy  symptoms. Although it requires time and patience, the payback can be long-term relief. Allergy  injections contain a dilute solution of those substances that you are allergic to based upon your skin testing and allergy  history.   How Do Allergy  Shots Work?  Allergy  shots work much like a vaccine. Your body responds to injected amounts of a particular allergen given in increasing doses, eventually developing a resistance and tolerance to it. Allergy  shots can lead to decreased, minimal or no allergy  symptoms.  There generally are  two phases: build-up and maintenance. Build-up often ranges from three to six months and involves receiving injections with increasing amounts of the allergens. The shots are typically given once or twice a week, though more rapid build-up schedules are sometimes used.  The maintenance phase begins when the  most effective dose is reached. This dose is different for each person, depending on how allergic you are and your response to the build-up injections. Once the maintenance dose is reached, there are longer periods between injections, typically two to four weeks.  Occasionally doctors give cortisone-type shots that can temporarily reduce allergy  symptoms. These types of shots are different and should not be confused with allergy  immunotherapy shots.  Who Can Be Treated with Allergy  Shots?  Allergy  shots may be a good treatment approach for people with allergic rhinitis (hay fever), allergic asthma, conjunctivitis (eye allergy ) or stinging insect allergy .   Before deciding to begin allergy  shots, you should consider:   The length of allergy  season and the severity of your symptoms  Whether medications and/or changes to your environment can control your symptoms  Your desire to avoid long-term medication use  Time: allergy  immunotherapy requires a major time commitment  Cost: may vary depending on your insurance coverage  Allergy  shots for children age 74 and older are effective and often well tolerated. They might prevent the onset of new allergen sensitivities or the progression to asthma.  Allergy  shots are not started on patients who are pregnant but can be continued on patients who become pregnant while receiving them. In some patients with other medical conditions or who take certain common medications, allergy  shots may be of risk. It is important to mention other medications you talk to your allergist.   What are the two types of build-ups offered:   RUSH or Rapid Desensitization -- one day of injections lasting from 8:30-4:30pm, injections every 1 hour.  Approximately half of the build-up process is completed in that one day.  The following week, normal build-up is resumed, and this entails ~16 visits either weekly or twice weekly, until reaching your "maintenance dose" which is  continued weekly until eventually getting spaced out to every month for a duration of 3 to 5 years. The regular build-up appointments are nurse visits where the injections are administered, followed by required monitoring for 30 minutes.    Traditional build-up -- weekly visits for 6 -12 months until reaching "maintenance dose", then continue weekly until eventually spacing out to every 4 weeks as above. At these appointments, the injections are administered, followed by required monitoring for 30 minutes.     Either way is acceptable, and both are equally effective. With the rush protocol, the advantage is that less time is spent here for injections overall AND you would also reach maintenance dosing faster (which is when the clinical benefit starts to become more apparent). Not everyone is a candidate for rapid desensitization.   IF we proceed with the RUSH protocol, there are premedications which must be taken the day before and the day after the rush only (this includes antihistamines, steroids, and Singulair ).  After the rush day, no prednisone or Singulair  is required, and we just recommend antihistamines taken on your injection day.  What Is An Estimate of the Costs?  If you are interested in starting allergy  injections, please check with your insurance company about your coverage for both allergy  vial sets and allergy  injections.  Please do so prior to making the appointment to start injections.  The  following are CPT codes to give to your insurance company. These are the amounts we BILL to the insurance company, but the amount YOU WILL PAY and WE RECEIVE IS SUBSTANTIALLY LESS and depends on the contracts we have with different insurance companies.   Amount Billed to Insurance One allergy  vial set  CPT 95165   $ 1200     One injection   CPT 95115   $ 35  RUSH (Rapid Desensitization) CPT 95180 x 8 hours  $500/hour  Regarding the allergy  injections, your co-pay may or may not apply with each  injection, so please confirm this with your insurance company. When you start allergy  injections, 1 or 2 sets of vials are made based on your allergies.  Not all patients can be on one set of vials. A set of vials lasts 6 months to a year depending on how quickly you can proceed with your build-up of your allergy  injections. Vials are personalized for each patient depending on their specific allergens.  How often are allergy  injection given during the build-up period?   Injections are given at least weekly during the build-up period until your maintenance dose is achieved. Per the doctor's discretion, you may have the option of getting allergy  injections two times per week during the build-up period. However, there must be at least 48 hours between injections. The build-up period is usually completed within 6-12 months depending on your ability to schedule injections and for adjustments for reactions. When maintenance dose is reached, your injection schedule is gradually changed to every two weeks and later to every three weeks. Injections will then continue every 4 weeks. Usually, injections are continued for a total of 3-5 years.   When Will I Feel Better?  Some may experience decreased allergy  symptoms during the build-up phase. For others, it may take as long as 12 months on the maintenance dose. If there is no improvement after a year of maintenance, your allergist will discuss other treatment options with you.  If you aren't responding to allergy  shots, it may be because there is not enough dose of the allergen in your vaccine or there are missing allergens that were not identified during your allergy  testing. Other reasons could be that there are high levels of the allergen in your environment or major exposure to non-allergic triggers like tobacco smoke.  What Is the Length of Treatment?  Once the maintenance dose is reached, allergy  shots are generally continued for three to five years. The  decision to stop should be discussed with your allergist at that time. Some people may experience a permanent reduction of allergy  symptoms. Others may relapse and a longer course of allergy  shots can be considered.  What Are the Possible Reactions?  The two types of adverse reactions that can occur with allergy  shots are local and systemic. Common local reactions include very mild redness and swelling at the injection site, which can happen immediately or several hours after. Report a delayed reaction from your last injection. These include arm swelling or runny nose, watery eyes or cough that occurs within 12-24 hours after injection. A systemic reaction, which is less common, affects the entire body or a particular body system. They are usually mild and typically respond quickly to medications. Signs include increased allergy  symptoms such as sneezing, a stuffy nose or hives.   Rarely, a serious systemic reaction called anaphylaxis can develop. Symptoms include swelling in the throat, wheezing, a feeling of tightness in the chest, nausea or dizziness.  Most serious systemic reactions develop within 30 minutes of allergy  shots. This is why it is strongly recommended you wait in your doctor's office for 30 minutes after your injections. Your allergist is trained to watch for reactions, and his or her staff is trained and equipped with the proper medications to identify and treat them.   Report to the nurse immediately if you experience any of the following symptoms: swelling, itching or redness of the skin, hives, watery eyes/nose, breathing difficulty, excessive sneezing, coughing, stomach pain, diarrhea, or light headedness. These symptoms may occur within 15-20 minutes after injection and may require medication.   Who Should Administer Allergy  Shots?  The preferred location for receiving shots is your prescribing allergist's office. Injections can sometimes be given at another facility where the  physician and staff are trained to recognize and treat reactions, and have received instructions by your prescribing allergist.  What if I am late for an injection?   Injection dose will be adjusted depending upon how many days or weeks you are late for your injection.   What if I am sick?   Please report any illness to the nurse before receiving injections. She may adjust your dose or postpone injections depending on your symptoms. If you have fever, flu, sinus infection or chest congestion it is best to postpone allergy  injections until you are better. Never get an allergy  injection if your asthma is causing you problems. If your symptoms persist, seek out medical care to get your health problem under control.  What If I am or Become Pregnant:  Women that become pregnant should schedule an appointment with [facility] before receiving any further allergy  injections.

## 2023-10-11 NOTE — Progress Notes (Unsigned)
 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   Subjective:   [name] is a 23 y.o. female presenting today for follow up of  Chief Complaint  Patient presents with   Asthma    No flare ups. No use of inhalers   Allergic Rhinitis     Allergies have been okay. Itchy sometimes around her cats or dogs.     [name] has a history of the following: Patient Active Problem List   Diagnosis Date Noted   Gastroesophageal reflux disease 06/30/2018   Anaphylactic shock due to adverse food reaction 03/17/2018   Seasonal and perennial allergic rhinitis 03/17/2018   Seasonal allergic conjunctivitis 10/17/2015   Mild intermittent asthma, uncomplicated 10/17/2015   ADHD (attention deficit hyperactivity disorder), inattentive type 07/09/2015   Developmental dysgraphia 07/09/2015   Obesity, Class III, BMI 40-49.9 (morbid obesity) 07/09/2015    History obtained from: chart review and {Persons; PED relatives w/patient:19415::patient}.  Discussed the use of AI scribe software for clinical note transcription with the patient and/or guardian, who gave verbal consent to proceed.  [name] is a 23 y.o. female presenting for {Blank single:19197::a food challenge,a drug challenge,skin testing,a sick visit,an evaluation of ***,a follow up visit}.  She was last seen in April 2025.  At that time, asthma seem to have resolved.  We continue with Zyrtec  10 mg twice daily and added Mucinex twice daily to help with breaking up her mucus.  We talked about allergy  shots for  long-term control.  For her cholinergic urticaria, we continue with Zyrtec  and Pepcid  twice daily.  We also talked about starting Xolair.  Since last visit,  Asthma/Respiratory Symptom History: ***  Allergic Rhinitis Symptom History: ***  Food Allergy  Symptom History: ***  Skin Symptom History: ***  GERD Symptom History: ***  Infection Symptom History: ***  Otherwise, there have been no changes to her past medical history, surgical history, family history, or social history.    Review of systems otherwise negative other than that mentioned in the HPI.    Objective:   Blood pressure 118/68, pulse 86, temperature 97.9 F (36.6 C), temperature source Temporal, resp. rate 18, SpO2 98%. There is no height or weight on file to calculate BMI.    Physical Exam   Diagnostic studies: {Blank single:19197::none,deferred due to recent antihistamine use,deferred due to insurance stipulations that require a separate visit for testing,labs sent instead, }  Spirometry: {Blank single:19197::results normal (FEV1: ***%, FVC: ***%, FEV1/FVC: ***%),results abnormal (FEV1: ***%, FVC: ***%, FEV1/FVC: ***%)}.    {Blank single:19197::Spirometry consistent with mild obstructive disease,Spirometry consistent with moderate obstructive disease,Spirometry consistent with severe obstructive disease,Spirometry consistent with possible restrictive disease,Spirometry consistent with mixed obstructive and restrictive disease,Spirometry uninterpretable due to technique,Spirometry consistent with normal pattern}. {Blank single:19197::Albuterol /Atrovent nebulizer,Xopenex/Atrovent nebulizer,Albuterol  nebulizer,Albuterol  four puffs via MDI,Xopenex four puffs via MDI} treatment given in clinic with {Blank single:19197::significant improvement in FEV1 per ATS criteria,significant improvement in FVC per ATS criteria,significant improvement in FEV1 and FVC per ATS  criteria,improvement in FEV1, but not significant per ATS criteria,improvement in FVC, but not significant per ATS criteria,improvement in FEV1 and  FVC, but not significant per ATS criteria,no improvement}.  Allergy  Studies: {Blank single:19197::none,deferred due to recent antihistamine use,deferred due to insurance stipulations that require a separate visit for testing,labs sent instead, }    {Blank single:19197::Allergy  testing results were read and interpreted by myself, documented by clinical staff., }      Danielle Shaggy, MD  [facility]
